# Patient Record
Sex: Female | Born: 1960
Health system: Southern US, Community
[De-identification: ages and names within clinical notes are randomized; demographics above are authoritative.]

## PROBLEM LIST (undated history)

## (undated) DIAGNOSIS — A6000 Herpesviral infection of urogenital system, unspecified: Secondary | ICD-10-CM

## (undated) DIAGNOSIS — M359 Systemic involvement of connective tissue, unspecified: Secondary | ICD-10-CM

## (undated) DIAGNOSIS — F329 Major depressive disorder, single episode, unspecified: Secondary | ICD-10-CM

## (undated) DIAGNOSIS — F419 Anxiety disorder, unspecified: Secondary | ICD-10-CM

## (undated) DIAGNOSIS — N870 Mild cervical dysplasia: Secondary | ICD-10-CM

## (undated) DIAGNOSIS — D649 Anemia, unspecified: Secondary | ICD-10-CM

## (undated) DIAGNOSIS — F32A Depression, unspecified: Secondary | ICD-10-CM

## (undated) DIAGNOSIS — Z9852 Vasectomy status: Secondary | ICD-10-CM

## (undated) DIAGNOSIS — Z1211 Encounter for screening for malignant neoplasm of colon: Secondary | ICD-10-CM

## (undated) HISTORY — DX: Depression, unspecified: F32.A

## (undated) HISTORY — DX: Encounter for screening for malignant neoplasm of colon: Z12.11

## (undated) HISTORY — DX: Vasectomy status: Z98.52

## (undated) HISTORY — DX: Major depressive disorder, single episode, unspecified: F32.9

## (undated) HISTORY — DX: Mild cervical dysplasia: N87.0

## (undated) HISTORY — PX: VASECTOMY: SHX75

## (undated) HISTORY — DX: Anemia, unspecified: D64.9

## (undated) HISTORY — DX: Herpesviral infection of urogenital system, unspecified: A60.00

## (undated) HISTORY — DX: Anxiety disorder, unspecified: F41.9

## (undated) HISTORY — PX: FOOT SURGERY: SHX648

---
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
## 1999-05-09 ENCOUNTER — Other Ambulatory Visit: Admission: RE | Admit: 1999-05-09 | Discharge: 1999-05-09 | Payer: Self-pay | Admitting: Obstetrics and Gynecology

## 2000-05-28 ENCOUNTER — Other Ambulatory Visit: Admission: RE | Admit: 2000-05-28 | Discharge: 2000-05-28 | Payer: Self-pay | Admitting: Obstetrics and Gynecology

## 2000-06-12 ENCOUNTER — Encounter: Payer: Self-pay | Admitting: Obstetrics and Gynecology

## 2000-06-12 ENCOUNTER — Ambulatory Visit (HOSPITAL_COMMUNITY): Admission: RE | Admit: 2000-06-12 | Discharge: 2000-06-12 | Payer: Self-pay | Admitting: Obstetrics and Gynecology

## 2001-06-02 ENCOUNTER — Other Ambulatory Visit: Admission: RE | Admit: 2001-06-02 | Discharge: 2001-06-02 | Payer: Self-pay | Admitting: Obstetrics and Gynecology

## 2001-06-15 ENCOUNTER — Encounter: Payer: Self-pay | Admitting: Obstetrics and Gynecology

## 2001-06-15 ENCOUNTER — Ambulatory Visit (HOSPITAL_COMMUNITY): Admission: RE | Admit: 2001-06-15 | Discharge: 2001-06-15 | Payer: Self-pay | Admitting: Obstetrics and Gynecology

## 2002-06-20 ENCOUNTER — Other Ambulatory Visit: Admission: RE | Admit: 2002-06-20 | Discharge: 2002-06-20 | Payer: Self-pay | Admitting: Obstetrics and Gynecology

## 2002-06-29 ENCOUNTER — Encounter: Admission: RE | Admit: 2002-06-29 | Discharge: 2002-06-29 | Payer: Self-pay | Admitting: Obstetrics and Gynecology

## 2002-06-29 ENCOUNTER — Encounter: Payer: Self-pay | Admitting: Obstetrics and Gynecology

## 2003-08-12 DIAGNOSIS — N870 Mild cervical dysplasia: Secondary | ICD-10-CM

## 2003-08-12 HISTORY — DX: Mild cervical dysplasia: N87.0

## 2003-08-14 ENCOUNTER — Other Ambulatory Visit: Admission: RE | Admit: 2003-08-14 | Discharge: 2003-08-14 | Payer: Self-pay | Admitting: Obstetrics and Gynecology

## 2003-09-25 ENCOUNTER — Encounter: Admission: RE | Admit: 2003-09-25 | Discharge: 2003-09-25 | Payer: Self-pay | Admitting: Obstetrics and Gynecology

## 2003-12-18 ENCOUNTER — Other Ambulatory Visit: Admission: RE | Admit: 2003-12-18 | Discharge: 2003-12-18 | Payer: Self-pay | Admitting: Obstetrics and Gynecology

## 2004-05-22 ENCOUNTER — Other Ambulatory Visit: Admission: RE | Admit: 2004-05-22 | Discharge: 2004-05-22 | Payer: Self-pay | Admitting: Obstetrics and Gynecology

## 2004-08-15 ENCOUNTER — Other Ambulatory Visit: Admission: RE | Admit: 2004-08-15 | Discharge: 2004-08-15 | Payer: Self-pay | Admitting: Obstetrics and Gynecology

## 2004-09-26 ENCOUNTER — Encounter: Admission: RE | Admit: 2004-09-26 | Discharge: 2004-09-26 | Payer: Self-pay | Admitting: Obstetrics and Gynecology

## 2004-11-07 ENCOUNTER — Ambulatory Visit: Payer: Self-pay | Admitting: Unknown Physician Specialty

## 2005-09-03 ENCOUNTER — Other Ambulatory Visit: Admission: RE | Admit: 2005-09-03 | Discharge: 2005-09-03 | Payer: Self-pay | Admitting: Obstetrics and Gynecology

## 2005-11-04 ENCOUNTER — Encounter: Admission: RE | Admit: 2005-11-04 | Discharge: 2005-11-04 | Payer: Self-pay | Admitting: Obstetrics and Gynecology

## 2006-03-16 ENCOUNTER — Ambulatory Visit: Payer: Self-pay | Admitting: Urology

## 2006-10-21 ENCOUNTER — Other Ambulatory Visit: Admission: RE | Admit: 2006-10-21 | Discharge: 2006-10-21 | Payer: Self-pay | Admitting: Obstetrics and Gynecology

## 2006-11-12 ENCOUNTER — Encounter: Admission: RE | Admit: 2006-11-12 | Discharge: 2006-11-12 | Payer: Self-pay | Admitting: Obstetrics and Gynecology

## 2007-11-11 ENCOUNTER — Other Ambulatory Visit: Admission: RE | Admit: 2007-11-11 | Discharge: 2007-11-11 | Payer: Self-pay | Admitting: Obstetrics and Gynecology

## 2007-11-29 ENCOUNTER — Encounter: Admission: RE | Admit: 2007-11-29 | Discharge: 2007-11-29 | Payer: Self-pay | Admitting: Obstetrics and Gynecology

## 2009-01-04 ENCOUNTER — Encounter: Admission: RE | Admit: 2009-01-04 | Discharge: 2009-01-04 | Payer: Self-pay | Admitting: Obstetrics and Gynecology

## 2009-02-02 ENCOUNTER — Emergency Department: Payer: Self-pay | Admitting: Emergency Medicine

## 2009-02-16 ENCOUNTER — Encounter: Payer: Self-pay | Admitting: Obstetrics and Gynecology

## 2009-02-16 ENCOUNTER — Ambulatory Visit: Payer: Self-pay | Admitting: Obstetrics and Gynecology

## 2009-02-16 ENCOUNTER — Other Ambulatory Visit: Admission: RE | Admit: 2009-02-16 | Discharge: 2009-02-16 | Payer: Self-pay | Admitting: Obstetrics and Gynecology

## 2009-08-29 ENCOUNTER — Ambulatory Visit: Payer: Self-pay | Admitting: Obstetrics and Gynecology

## 2010-02-01 ENCOUNTER — Encounter: Admission: RE | Admit: 2010-02-01 | Discharge: 2010-02-01 | Payer: Self-pay | Admitting: Obstetrics and Gynecology

## 2010-02-21 ENCOUNTER — Ambulatory Visit: Payer: Self-pay | Admitting: Obstetrics and Gynecology

## 2010-02-21 ENCOUNTER — Other Ambulatory Visit: Admission: RE | Admit: 2010-02-21 | Discharge: 2010-02-21 | Payer: Self-pay | Admitting: Obstetrics and Gynecology

## 2010-11-13 ENCOUNTER — Emergency Department: Payer: Self-pay | Admitting: Emergency Medicine

## 2011-01-10 ENCOUNTER — Other Ambulatory Visit: Payer: Self-pay | Admitting: Obstetrics and Gynecology

## 2011-01-10 DIAGNOSIS — Z1231 Encounter for screening mammogram for malignant neoplasm of breast: Secondary | ICD-10-CM

## 2011-02-05 ENCOUNTER — Ambulatory Visit
Admission: RE | Admit: 2011-02-05 | Discharge: 2011-02-05 | Disposition: A | Discharge status: Home / Self Care | Payer: 59 | Source: Ambulatory Visit | Attending: Obstetrics and Gynecology | Admitting: Obstetrics and Gynecology

## 2011-02-05 DIAGNOSIS — Z1231 Encounter for screening mammogram for malignant neoplasm of breast: Secondary | ICD-10-CM

## 2011-02-25 ENCOUNTER — Other Ambulatory Visit: Payer: Self-pay | Admitting: Obstetrics and Gynecology

## 2011-02-25 ENCOUNTER — Encounter (INDEPENDENT_AMBULATORY_CARE_PROVIDER_SITE_OTHER): Payer: BC Managed Care – PPO | Admitting: Obstetrics and Gynecology

## 2011-02-25 ENCOUNTER — Other Ambulatory Visit (HOSPITAL_COMMUNITY)
Admission: RE | Admit: 2011-02-25 | Discharge: 2011-02-25 | Disposition: A | Discharge status: Home / Self Care | Payer: BC Managed Care – PPO | Source: Ambulatory Visit | Attending: Obstetrics and Gynecology | Admitting: Obstetrics and Gynecology

## 2011-02-25 DIAGNOSIS — Z124 Encounter for screening for malignant neoplasm of cervix: Secondary | ICD-10-CM | POA: Insufficient documentation

## 2011-02-25 DIAGNOSIS — R823 Hemoglobinuria: Secondary | ICD-10-CM

## 2011-02-25 DIAGNOSIS — Z01419 Encounter for gynecological examination (general) (routine) without abnormal findings: Secondary | ICD-10-CM

## 2011-03-20 DIAGNOSIS — Z1211 Encounter for screening for malignant neoplasm of colon: Secondary | ICD-10-CM

## 2011-03-24 ENCOUNTER — Other Ambulatory Visit: Payer: Self-pay | Admitting: *Deleted

## 2011-03-24 DIAGNOSIS — Z1211 Encounter for screening for malignant neoplasm of colon: Secondary | ICD-10-CM

## 2011-03-24 LAB — POC HEMOCCULT BLD/STL (OFFICE/1-CARD/DIAGNOSTIC): Fecal Occult Blood, POC: NEGATIVE

## 2011-04-03 ENCOUNTER — Telehealth: Payer: Self-pay | Admitting: *Deleted

## 2011-04-03 DIAGNOSIS — N912 Amenorrhea, unspecified: Secondary | ICD-10-CM

## 2011-04-03 NOTE — Telephone Encounter (Signed)
I suspect she is menopausal. Have her come in for [facility]. I will order it. Ask her to call me in a week for the results and I will come to the phone.

## 2011-04-03 NOTE — Telephone Encounter (Signed)
Patient took medroxyprogesterone 10mg  for 5 days as instructed.  Last pill was taken on August 1st, and still has not gotten period.  She has been having cramping but no spotting or flow. Does she need to take another round or other plan?

## 2011-04-03 NOTE — Telephone Encounter (Signed)
Left detailed message on patients voice mail per her request.  To call back to set lab appointment and call next week for results and DG will come to the phone.

## 2011-04-15 ENCOUNTER — Ambulatory Visit: Payer: Self-pay | Admitting: Sports Medicine

## 2011-04-22 ENCOUNTER — Other Ambulatory Visit (INDEPENDENT_AMBULATORY_CARE_PROVIDER_SITE_OTHER): Payer: BC Managed Care – PPO

## 2011-04-22 DIAGNOSIS — N912 Amenorrhea, unspecified: Secondary | ICD-10-CM

## 2011-05-05 ENCOUNTER — Encounter: Payer: Self-pay | Admitting: Sports Medicine

## 2011-05-12 ENCOUNTER — Encounter: Payer: Self-pay | Admitting: Sports Medicine

## 2011-05-18 ENCOUNTER — Other Ambulatory Visit: Payer: Self-pay | Admitting: Obstetrics and Gynecology

## 2012-02-10 ENCOUNTER — Other Ambulatory Visit: Payer: Self-pay | Admitting: Obstetrics and Gynecology

## 2012-02-10 DIAGNOSIS — Z9889 Other specified postprocedural states: Secondary | ICD-10-CM

## 2012-02-10 DIAGNOSIS — Z1231 Encounter for screening mammogram for malignant neoplasm of breast: Secondary | ICD-10-CM

## 2012-02-18 ENCOUNTER — Ambulatory Visit
Admission: RE | Admit: 2012-02-18 | Discharge: 2012-02-18 | Disposition: A | Discharge status: Home / Self Care | Payer: BC Managed Care – PPO | Source: Ambulatory Visit | Attending: Obstetrics and Gynecology | Admitting: Obstetrics and Gynecology

## 2012-02-18 DIAGNOSIS — Z9889 Other specified postprocedural states: Secondary | ICD-10-CM

## 2012-02-18 DIAGNOSIS — Z1231 Encounter for screening mammogram for malignant neoplasm of breast: Secondary | ICD-10-CM

## 2012-05-05 ENCOUNTER — Ambulatory Visit: Payer: Self-pay | Admitting: Family Medicine

## 2012-06-09 ENCOUNTER — Other Ambulatory Visit: Payer: Self-pay | Admitting: Obstetrics and Gynecology

## 2012-06-14 ENCOUNTER — Other Ambulatory Visit: Payer: Self-pay | Admitting: Obstetrics and Gynecology

## 2012-07-19 ENCOUNTER — Encounter: Payer: Self-pay | Admitting: Obstetrics and Gynecology

## 2012-07-19 ENCOUNTER — Other Ambulatory Visit (HOSPITAL_COMMUNITY)
Admission: RE | Admit: 2012-07-19 | Discharge: 2012-07-19 | Disposition: A | Discharge status: Home / Self Care | Payer: BC Managed Care – PPO | Source: Ambulatory Visit | Attending: Obstetrics and Gynecology | Admitting: Obstetrics and Gynecology

## 2012-07-19 ENCOUNTER — Ambulatory Visit (INDEPENDENT_AMBULATORY_CARE_PROVIDER_SITE_OTHER): Payer: BC Managed Care – PPO | Admitting: Obstetrics and Gynecology

## 2012-07-19 VITALS — BP 124/76 | Ht 62.0 in | Wt 172.0 lb

## 2012-07-19 DIAGNOSIS — Z01419 Encounter for gynecological examination (general) (routine) without abnormal findings: Secondary | ICD-10-CM

## 2012-07-19 DIAGNOSIS — N870 Mild cervical dysplasia: Secondary | ICD-10-CM | POA: Insufficient documentation

## 2012-07-19 DIAGNOSIS — Z8619 Personal history of other infectious and parasitic diseases: Secondary | ICD-10-CM | POA: Insufficient documentation

## 2012-07-19 DIAGNOSIS — A6000 Herpesviral infection of urogenital system, unspecified: Secondary | ICD-10-CM | POA: Insufficient documentation

## 2012-07-19 MED ORDER — VALTREX 1 G PO TABS: 1000.0000 mg | ORAL_TABLET | Freq: Every day | ORAL | Status: DC

## 2012-07-19 NOTE — Progress Notes (Signed)
Patient came to see me today for her annual GYN exam. Last year she became amenorrheic. She had an elevated FSH. She does have hot flashes and sleep disturbance. She does not want to take HRT. She sees a rheumatologist in  [place] she's moved to. She is up-to-date on mammograms. She has never had a bone density. She is having no vaginal bleeding. She is having no pelvic pain. In 2005 she had CIN-1 on Pap smear. Colposcopy with biopsy confirmed it. We watched it and she is now had 9 normal Pap smears. She takes Valtrex for HSV 2 with good results. Several years ago a colonoscopy was attempted in [place]. The gastroenterologist said he could not get through her colon due to tortuosity. She does her lab elsewhere.  Physical examination:Kim Julian Reil present. HEENT within normal limits. Neck: Thyroid not large. No masses. Supraclavicular nodes: not enlarged. Breasts: Examined in both sitting and lying  position. No skin changes and no masses. Abdomen: Soft no guarding rebound or masses or hernia. Pelvic: External: Within normal limits. BUS: Within normal limits. Vaginal:within normal limits. Good estrogen effect. No evidence of cystocele rectocele or enterocele. Cervix: clean. Uterus: Normal size and shape. Adnexa: No masses. Rectovaginal exam: Confirmatory and negative. Extremities: Within normal limits.  Assessment: #1. CIN-1 #2. Hot flashes #3. HSV-2  Plan: Continue Valtrex. Continue yearly mammograms. She will find a gynecologist in [place]. She will get a bone density in [place]. She will find a gastroenterologist in [place]. We discussed virtual  colonoscopy. Pap done.The new Pap smear guidelines were discussed with the patient.

## 2012-07-19 NOTE — Addendum Note (Signed)
Addended by: Dayna Barker on: 07/19/2012 11:45 AM   Modules accepted: Orders

## 2012-07-19 NOTE — Patient Instructions (Addendum)
Continue yearly mammograms. Have your rheumatologist find you a gynecologist and a gastroenterologist. Have a colonoscopy. If that is not possible discuss  a virtual colonoscopy. Scheduled bone density.

## 2012-07-20 LAB — URINALYSIS W MICROSCOPIC + REFLEX CULTURE
Bacteria, UA: NONE SEEN
Bilirubin Urine: NEGATIVE
Ketones, ur: NEGATIVE mg/dL
Nitrite: NEGATIVE
Protein, ur: NEGATIVE mg/dL
Squamous Epithelial / LPF: NONE SEEN
Urobilinogen, UA: 0.2 mg/dL (ref 0.0–1.0)

## 2014-06-12 ENCOUNTER — Encounter: Payer: Self-pay | Admitting: Obstetrics and Gynecology

## 2015-02-26 ENCOUNTER — Encounter: Payer: Self-pay | Admitting: Family Medicine

## 2015-02-26 ENCOUNTER — Ambulatory Visit (INDEPENDENT_AMBULATORY_CARE_PROVIDER_SITE_OTHER): Payer: BLUE CROSS/BLUE SHIELD | Admitting: Family Medicine

## 2015-02-26 VITALS — BP 110/68 | HR 76 | Temp 97.9°F | Resp 16 | Ht 63.0 in | Wt 140.0 lb

## 2015-02-26 DIAGNOSIS — R350 Frequency of micturition: Secondary | ICD-10-CM

## 2015-02-26 DIAGNOSIS — R609 Edema, unspecified: Secondary | ICD-10-CM | POA: Insufficient documentation

## 2015-02-26 DIAGNOSIS — E860 Dehydration: Secondary | ICD-10-CM | POA: Diagnosis not present

## 2015-02-26 DIAGNOSIS — D649 Anemia, unspecified: Secondary | ICD-10-CM

## 2015-02-26 DIAGNOSIS — M069 Rheumatoid arthritis, unspecified: Secondary | ICD-10-CM | POA: Insufficient documentation

## 2015-02-26 DIAGNOSIS — F32A Depression, unspecified: Secondary | ICD-10-CM | POA: Insufficient documentation

## 2015-02-26 DIAGNOSIS — N3090 Cystitis, unspecified without hematuria: Secondary | ICD-10-CM | POA: Diagnosis not present

## 2015-02-26 DIAGNOSIS — K589 Irritable bowel syndrome without diarrhea: Secondary | ICD-10-CM | POA: Insufficient documentation

## 2015-02-26 DIAGNOSIS — F3281 Premenstrual dysphoric disorder: Secondary | ICD-10-CM | POA: Insufficient documentation

## 2015-02-26 DIAGNOSIS — J309 Allergic rhinitis, unspecified: Secondary | ICD-10-CM | POA: Insufficient documentation

## 2015-02-26 DIAGNOSIS — E7800 Pure hypercholesterolemia, unspecified: Secondary | ICD-10-CM | POA: Insufficient documentation

## 2015-02-26 DIAGNOSIS — F329 Major depressive disorder, single episode, unspecified: Secondary | ICD-10-CM | POA: Insufficient documentation

## 2015-02-26 DIAGNOSIS — M3500 Sicca syndrome, unspecified: Secondary | ICD-10-CM | POA: Insufficient documentation

## 2015-02-26 DIAGNOSIS — K219 Gastro-esophageal reflux disease without esophagitis: Secondary | ICD-10-CM | POA: Insufficient documentation

## 2015-02-26 HISTORY — DX: Anemia, unspecified: D64.9

## 2015-02-26 LAB — POCT URINALYSIS DIPSTICK
BILIRUBIN UA: NEGATIVE
Glucose, UA: NEGATIVE
KETONES UA: NEGATIVE
Leukocytes, UA: NEGATIVE
NITRITE UA: NEGATIVE
Protein, UA: NEGATIVE
RBC UA: NEGATIVE
Spec Grav, UA: 1.015
Urobilinogen, UA: 0.2
pH, UA: 7

## 2015-02-26 NOTE — Progress Notes (Signed)
Subjective:    Patient ID: Cheryl Cooke, female    DOB: 09-29-1960, 54 y.o.   MRN: [id number]  Urinary Frequency  This is a new problem. The current episode started in the past 7 days. The problem has been waxing and waning. The quality of the pain is described as aching. The pain is at a severity of 4/10. The pain is moderate. There has been no fever. Associated symptoms include chills, flank pain, frequency, hematuria, hesitancy, nausea and urgency. Pertinent negatives include no discharge, possible pregnancy, sweats or vomiting. She has tried increased fluids for the symptoms.   Was having nausea when she was driving.  That part is better. Also, had sharp back pain with squatting. Does have some lower abdominal tenderness.  Was probably dehydrated at the time.   Review of Systems  Constitutional: Positive for chills.  Gastrointestinal: Positive for nausea. Negative for vomiting.  Genitourinary: Positive for hesitancy, urgency, frequency, hematuria and flank pain.   BP 110/68 mmHg  Pulse 76  Temp(Src) 97.9 F (36.6 C) (Oral)  Resp 16  Ht 5\' 3"  (1.6 m)  Wt 140 lb (63.504 kg)  BMI 24.81 kg/m2   Patient Active Problem List   Diagnosis Date Noted  . Allergic rhinitis 02/26/2015  . Absolute anemia 02/26/2015  . Clinical depression 02/26/2015  . Accumulation of fluid in tissues 02/26/2015  . Acid reflux 02/26/2015  . Herpes 02/26/2015  . Hypercholesteremia 02/26/2015  . Adaptive colitis 02/26/2015  . Late luteal phase dysphoric disorder (LLPDD) 02/26/2015  . Arthritis or polyarthritis, rheumatoid 02/26/2015  . Gougerout-Sjoegren syndrome 02/26/2015  . Elevated cholesterol   . Dysplasia of cervix, low grade (CIN 1)   . Rheumatoid arthritis(714.0)   . Herpes progenitalis    Past Medical History  Diagnosis Date  . H/O: vasectomy     HUSBAND HAS HAD VASECTOMY  . Dysplasia of cervix, low grade (CIN 1)     OBSERVATION  . Rheumatoid arthritis(714.0)   . Elevated  cholesterol   . Herpes progenitalis    No current outpatient prescriptions on file prior to visit.   No current facility-administered medications on file prior to visit.   Allergies  Allergen Reactions  . Biaxin [Clarithromycin]   . Clarithromycin Nausea Only  . Levofloxacin     Other reaction(s): Nausea  . Shrimp [Shellfish Allergy] Swelling  . Sulfa Antibiotics     ? REACTION   Past Surgical History  Procedure Laterality Date  . Foot surgery  1979 AND 2002    RIGHT 1979, LEFT 2002; Bunion and hammertoes  . Breast surgery Bilateral 1979    REDUCTION   History   Social History  . Marital Status: Married    Spouse Name: N/A  . Number of Children: 1  . Years of Education: H/S   Occupational History  . Massage Therapist    Social History Main Topics  . Smoking status: Former Smoker -- 3 years    Quit date: 08/11/1983  . Smokeless tobacco: Never Used     Comment: was a social smoker  . Alcohol Use: Yes     Comment: Rare  . Drug Use: No  . Sexual Activity: Yes    Birth Control/ Protection: Post-menopausal   Other Topics Concern  . Not on file   Social History Narrative   Family History  Problem Relation Age of Onset  . Diabetes Mother   . Hypertension Mother   . Alzheimer's disease Mother   . Allergies Mother   .  Stroke Mother   . Heart attack Mother   . Heart disease Father   . Gout Father   . Heart attack Father   . Cancer Maternal Grandmother     UTERINE CANCER  . Stroke Maternal Grandfather       Objective:   Physical Exam  Constitutional: She is oriented to person, place, and time. She appears well-developed and well-nourished.  Abdominal: Soft. Bowel sounds are normal.  Neurological: She is alert and oriented to person, place, and time.  Psychiatric: She has a normal mood and affect. Her behavior is normal.   BP 110/68 mmHg  Pulse 76  Temp(Src) 97.9 F (36.6 C) (Oral)  Resp 16  Ht 5\' 3"  (1.6 m)  Wt 140 lb (63.504 kg)  BMI 24.81 kg/m2       Assessment & Plan:   1. Urinary frequency Normal urine today. Patient is feeling better. Last few "infections" have had negative cultures.   - POCT urinalysis dipstick  2. Cystitis Improved today.   3. Dehydration Suspect cause of initial symptoms when driving. Specific gravity is 1015, still room for improvement. Increase fluid intake. Please call back if condition worsens or does not continue to improve.     Zanyia Rana, MD

## 2015-05-09 ENCOUNTER — Encounter: Payer: Self-pay | Admitting: Family Medicine

## 2015-05-09 ENCOUNTER — Ambulatory Visit (INDEPENDENT_AMBULATORY_CARE_PROVIDER_SITE_OTHER): Payer: BLUE CROSS/BLUE SHIELD | Admitting: Family Medicine

## 2015-05-09 VITALS — BP 100/62 | HR 74 | Temp 97.8°F | Resp 16 | Ht 63.0 in | Wt 142.0 lb

## 2015-05-09 DIAGNOSIS — E7800 Pure hypercholesterolemia, unspecified: Secondary | ICD-10-CM

## 2015-05-09 DIAGNOSIS — M069 Rheumatoid arthritis, unspecified: Secondary | ICD-10-CM

## 2015-05-09 DIAGNOSIS — Z23 Encounter for immunization: Secondary | ICD-10-CM

## 2015-05-09 DIAGNOSIS — Z1239 Encounter for other screening for malignant neoplasm of breast: Secondary | ICD-10-CM | POA: Diagnosis not present

## 2015-05-09 DIAGNOSIS — Z1211 Encounter for screening for malignant neoplasm of colon: Secondary | ICD-10-CM | POA: Diagnosis not present

## 2015-05-09 DIAGNOSIS — Z0000 Encounter for general adult medical examination without abnormal findings: Secondary | ICD-10-CM | POA: Diagnosis not present

## 2015-05-09 LAB — POCT URINALYSIS DIPSTICK
Bilirubin, UA: NEGATIVE
Blood, UA: NEGATIVE
Glucose, UA: NEGATIVE
KETONES UA: NEGATIVE
Leukocytes, UA: NEGATIVE
Nitrite, UA: NEGATIVE
Protein, UA: NEGATIVE
Spec Grav, UA: 1.01
UROBILINOGEN UA: 0.2
pH, UA: 6.5

## 2015-05-09 LAB — IFOBT (OCCULT BLOOD): IFOBT: NEGATIVE

## 2015-05-09 NOTE — Progress Notes (Signed)
Patient ID: ORINE GOGA, female   DOB: 1960/12/29, 54 y.o.   MRN: [id number]        [name], Female    DOB: 1960/11/29, 54 y.o.   MRN: [id number] Visit Date: 05/09/2015  Today's Provider: Livia Rana, MD   Chief Complaint  Patient presents with  . Annual Exam   Subjective:    Annual physical exam SAMHITA KRETSCH is a 54 y.o. female who presents today for health maintenance and complete physical. She feels well. She reports exercising daily. She reports she is sleeping well.  04/18/14 CPE 04/18/14 Pap-neg; HPV-neg 04/18/14 EKG 02/18/12 Mammogram  Results for orders placed or performed in visit on 05/09/15  POCT urinalysis dipstick  Result Value Ref Range   Color, UA straw    Clarity, UA clear    Glucose, UA neg    Bilirubin, UA neg    Ketones, UA neg    Spec Grav, UA 1.010    Blood, UA neg    pH, UA 6.5    Protein, UA neg    Urobilinogen, UA 0.2    Nitrite, UA neg    Leukocytes, UA Negative Negative    -----------------------------------------------------------------   Review of Systems  Constitutional: Negative.   HENT: Positive for congestion and sinus pressure.   Eyes: Negative.   Respiratory: Negative.   Cardiovascular: Negative.   Gastrointestinal: Negative.   Endocrine: Negative.   Genitourinary: Negative.   Musculoskeletal: Positive for arthralgias.  Skin: Negative.   Allergic/Immunologic: Positive for environmental allergies.  Neurological: Negative.   Hematological: Negative.   Psychiatric/Behavioral: Negative.     Social History She  reports that she quit smoking about 31 years ago. She has never used smokeless tobacco. She reports that she drinks alcohol. She reports that she does not use illicit drugs. Social History   Social History  . Marital Status: Married    Spouse Name: N/A  . Number of Children: 1  . Years of Education: H/S   Occupational History  . Massage Therapist    Social History Main  Topics  . Smoking status: Former Smoker -- 3 years    Quit date: 08/11/1983  . Smokeless tobacco: Never Used     Comment: was a social smoker  . Alcohol Use: Yes     Comment: Rare  . Drug Use: No  . Sexual Activity: Yes    Birth Control/ Protection: Post-menopausal   Other Topics Concern  . None   Social History Narrative    Patient Active Problem List   Diagnosis Date Noted  . Allergic rhinitis 02/26/2015  . Absolute anemia 02/26/2015  . Clinical depression 02/26/2015  . Accumulation of fluid in tissues 02/26/2015  . Acid reflux 02/26/2015  . Herpes 02/26/2015  . Hypercholesteremia 02/26/2015  . Adaptive colitis 02/26/2015  . Late luteal phase dysphoric disorder (LLPDD) 02/26/2015  . Arthritis or polyarthritis, rheumatoid 02/26/2015  . Gougerout-Sjoegren syndrome 02/26/2015  . Cystitis 02/26/2015  . Dehydration 02/26/2015  . Elevated cholesterol   . Dysplasia of cervix, low grade (CIN 1)   . Rheumatoid arthritis(714.0)   . Herpes progenitalis     Past Surgical History  Procedure Laterality Date  . Foot surgery  1979 AND 2002    RIGHT 1979, LEFT 2002; Bunion and hammertoes  . Breast surgery Bilateral 1979    REDUCTION    Family History  Family Status  Relation Status Death Age  . Mother Deceased    Her family history includes Allergies in her  mother; Alzheimer's disease in her mother; Cancer in her maternal grandmother; Diabetes in her mother; Gout in her father; Heart attack in her father and mother; Heart disease in her father; Hypertension in her mother; Stroke in her maternal grandfather and mother.    Allergies  Allergen Reactions  . Biaxin [Clarithromycin]   . Clarithromycin Nausea Only  . Levofloxacin     Other reaction(s): Nausea  . Shrimp [Shellfish Allergy] Swelling  . Sulfa Antibiotics Swelling    Previous Medications   CALCIUM-VITAMIN D (OSCAL WITH D) 250-125 MG-UNIT PER TABLET    Take 1 tablet by mouth daily.    ETANERCEPT (ENBREL) 50  MG/ML INJECTION    Inject 50 mg into the skin.    FOLIC ACID (FOLVITE) 1 MG TABLET    Take 1 mg by mouth daily.    LEFLUNOMIDE (ARAVA) 20 MG TABLET    Take 20 mg by mouth daily.    MOMETASONE (ELOCON) 0.1 % CREAM        Patient Care Team: Shaquna Rana, MD as PCP - General (Family Medicine)     Objective:   Vitals: BP 100/62 mmHg  Pulse 74  Temp(Src) 97.8 F (36.6 C) (Oral)  Resp 16  Ht 5\' 3"  (1.6 m)  Wt 142 lb (64.411 kg)  BMI 25.16 kg/m2   Physical Exam  Constitutional: She is oriented to person, place, and time. She appears well-developed and well-nourished.  HENT:  Head: Normocephalic and atraumatic.  Right Ear: Tympanic membrane, external ear and ear canal normal.  Left Ear: Tympanic membrane, external ear and ear canal normal.  Nose: Nose normal.  Mouth/Throat: Uvula is midline, oropharynx is clear and moist and mucous membranes are normal.  Eyes: Conjunctivae, EOM and lids are normal. Pupils are equal, round, and reactive to light.  Neck: Trachea normal and normal range of motion. Neck supple. Carotid bruit is not present. No thyroid mass and no thyromegaly present.  Cardiovascular: Normal rate, regular rhythm and normal heart sounds.   Pulmonary/Chest: Effort normal and breath sounds normal.  Filling defect at 6 o'clock on right breast. Well healed surgical scar.  Abdominal: Soft. Normal appearance and bowel sounds are normal. There is no hepatosplenomegaly. There is no tenderness.  Genitourinary: No breast swelling, tenderness or discharge.  Musculoskeletal: Normal range of motion.  Lymphadenopathy:    She has no cervical adenopathy.    She has no axillary adenopathy.  Neurological: She is alert and oriented to person, place, and time. She has normal strength. No cranial nerve deficit.  Skin: Skin is warm, dry and intact.  Psychiatric: She has a normal mood and affect. Her speech is normal and behavior is normal. Judgment and thought content normal. Cognition and  memory are normal.     Depression Screen PHQ 2/9 Scores 05/09/2015  PHQ - 2 Score 0      Assessment & Plan:     Routine Health Maintenance and Physical Exam  Exercise Activities and Dietary recommendations Goals    . Exercise 150 minutes per week (moderate activity)       Immunization History  Administered Date(s) Administered  . Influenza,inj,Quad PF,36+ Mos 05/09/2015  . Tdap 04/18/2014    Health Maintenance  Topic Date Due  . HIV Screening  04/01/1975  . COLONOSCOPY  03/31/2010  . MAMMOGRAM  02/17/2014  . PAP SMEAR  07/20/2015  . INFLUENZA VACCINE  03/11/2016  . TETANUS/TDAP  04/18/2024  . Hepatitis C Screening  Completed       1. Annual  physical exam Stable. Patient advised to continue eating healthy and exercise daily. - POCT urinalysis dipstick  2. Need for influenza vaccination - Flu Vaccine QUAD 36+ mos IM  3. Breast cancer screening - MM DIGITAL SCREENING BILATERAL; Future  4. Colon cancer screening - IFOBT POC (occult bld, rslt in office)  5. Hypercholesteremia - CBC with Differential/Platelet - Comprehensive metabolic panel - Lipid Panel With LDL/HDL Ratio - TSH    6. Rheumatoid arthritis  Patient advised to continue regular F/U with her rheumatologist.       Patient seen and examined by Dr. Jerrell Belfast, and note scribed by Philbert Riser. Dimas, CMA.  I have reviewed the document for accuracy and completeness and I agree with above. Jerrell Belfast, MD   Antonisha Rana, MD    --------------------------------------------------------------------

## 2015-05-09 NOTE — Patient Instructions (Signed)
Please call the [place] at [facility] to schedule this at [phone]   Please call us back with information on where and when your colonoscopy was done.

## 2015-05-10 ENCOUNTER — Ambulatory Visit
Admission: RE | Admit: 2015-05-10 | Discharge: 2015-05-10 | Disposition: A | Discharge status: Home / Self Care | Payer: BLUE CROSS/BLUE SHIELD | Source: Ambulatory Visit | Attending: Family Medicine | Admitting: Family Medicine

## 2015-05-10 DIAGNOSIS — Z1231 Encounter for screening mammogram for malignant neoplasm of breast: Secondary | ICD-10-CM | POA: Insufficient documentation

## 2015-05-10 DIAGNOSIS — Z1239 Encounter for other screening for malignant neoplasm of breast: Secondary | ICD-10-CM

## 2015-05-10 LAB — CBC WITH DIFFERENTIAL/PLATELET
Basophils Absolute: 0 10*3/uL (ref 0.0–0.2)
Basos: 1 %
EOS (ABSOLUTE): 0.7 10*3/uL — ABNORMAL HIGH (ref 0.0–0.4)
Eos: 10 %
Hematocrit: 36.9 % (ref 34.0–46.6)
Hemoglobin: 12.4 g/dL (ref 11.1–15.9)
Immature Grans (Abs): 0 10*3/uL (ref 0.0–0.1)
Immature Granulocytes: 0 %
Lymphocytes Absolute: 2.3 10*3/uL (ref 0.7–3.1)
Lymphs: 36 %
MCH: 30.4 pg (ref 26.6–33.0)
MCHC: 33.6 g/dL (ref 31.5–35.7)
MCV: 90 fL (ref 79–97)
Monocytes Absolute: 0.7 10*3/uL (ref 0.1–0.9)
Monocytes: 11 %
Neutrophils Absolute: 2.7 10*3/uL (ref 1.4–7.0)
Neutrophils: 42 %
Platelets: 255 10*3/uL (ref 150–379)
RBC: 4.08 x10E6/uL (ref 3.77–5.28)
RDW: 13.9 % (ref 12.3–15.4)
WBC: 6.5 10*3/uL (ref 3.4–10.8)

## 2015-05-10 LAB — COMPREHENSIVE METABOLIC PANEL
ALT: 26 IU/L (ref 0–32)
AST: 39 IU/L (ref 0–40)
Albumin/Globulin Ratio: 1.6 (ref 1.1–2.5)
Albumin: 4.1 g/dL (ref 3.5–5.5)
Alkaline Phosphatase: 80 IU/L (ref 39–117)
BUN/Creatinine Ratio: 21 (ref 9–23)
BUN: 15 mg/dL (ref 6–24)
Bilirubin Total: 0.4 mg/dL (ref 0.0–1.2)
CO2: 24 mmol/L (ref 18–29)
Calcium: 10 mg/dL (ref 8.7–10.2)
Chloride: 100 mmol/L (ref 97–108)
Creatinine, Ser: 0.73 mg/dL (ref 0.57–1.00)
GFR calc Af Amer: 107 mL/min/{1.73_m2} (ref >59)
GFR calc non Af Amer: 93 mL/min/{1.73_m2} (ref >59)
Globulin, Total: 2.6 g/dL (ref 1.5–4.5)
Glucose: 91 mg/dL (ref 65–99)
Potassium: 4.9 mmol/L (ref 3.5–5.2)
Sodium: 138 mmol/L (ref 134–144)
Total Protein: 6.7 g/dL (ref 6.0–8.5)

## 2015-05-10 LAB — LIPID PANEL WITH LDL/HDL RATIO
CHOLESTEROL TOTAL: 261 mg/dL — AB (ref 100–199)
HDL: 73 mg/dL (ref >39)
LDL CALC: 155 mg/dL — AB (ref 0–99)
LDL/HDL RATIO: 2.1 ratio (ref 0.0–3.2)
Triglycerides: 166 mg/dL — ABNORMAL HIGH (ref 0–149)
VLDL CHOLESTEROL CAL: 33 mg/dL (ref 5–40)

## 2015-05-10 LAB — TSH: TSH: 1.6 u[IU]/mL (ref 0.450–4.500)

## 2015-05-11 ENCOUNTER — Telehealth: Payer: Self-pay

## 2015-05-11 NOTE — Telephone Encounter (Signed)
Patient was currently driving, asked to be reach after 4:30.  Thanks,  -Joseline

## 2015-05-11 NOTE — Telephone Encounter (Signed)
-----   Message from Vesna Rana, MD sent at 05/10/2015 10:25 AM EDT ----- Labs stable. Cholesterol is elevated but does have high good cholesterol.  Is complicated by RA, does put her at slightly increased risk of heart disease.   Can consider medication if patient would like or continue to eat healthy, exercise and recheck annually.   Thanks.

## 2015-05-14 NOTE — Telephone Encounter (Signed)
Patient advised as directed below. Per patient will call back to let Dr. Venia Minks know if decides to consider medication or continue to eat healthy and exercise.   Thanks,  -Jacion Dismore

## 2015-09-27 ENCOUNTER — Encounter: Payer: Self-pay | Admitting: Family Medicine

## 2015-09-27 ENCOUNTER — Ambulatory Visit (INDEPENDENT_AMBULATORY_CARE_PROVIDER_SITE_OTHER): Payer: BLUE CROSS/BLUE SHIELD | Admitting: Family Medicine

## 2015-09-27 VITALS — BP 122/72 | HR 88 | Temp 98.5°F | Resp 14 | Wt 134.2 lb

## 2015-09-27 DIAGNOSIS — M059 Rheumatoid arthritis with rheumatoid factor, unspecified: Secondary | ICD-10-CM

## 2015-09-27 DIAGNOSIS — K130 Diseases of lips: Secondary | ICD-10-CM

## 2015-09-27 DIAGNOSIS — J020 Streptococcal pharyngitis: Secondary | ICD-10-CM

## 2015-09-27 DIAGNOSIS — M3500 Sicca syndrome, unspecified: Secondary | ICD-10-CM | POA: Diagnosis not present

## 2015-09-27 LAB — POCT RAPID STREP A (OFFICE): Rapid Strep A Screen: POSITIVE — AB

## 2015-09-27 MED ORDER — AMOXICILLIN 875 MG PO TABS: 875.0000 mg | ORAL_TABLET | Freq: Two times a day (BID) | ORAL | Status: DC

## 2015-09-27 NOTE — Progress Notes (Signed)
Patient ID: Cheryl Cooke, female   DOB: 1961/01/06, 55 y.o.   MRN: [id number]   [name] Female    DOB: 1961/01/16   55 y.o.   MRN: [id number] Visit Date: 09/27/2015  Today's Provider: Vernie Murders, PA   Chief Complaint  Patient presents with  . Oral Swelling    lip   Subjective:    HPI Patient presents with lip swelling and sore areas on each corner of bottom lip X 3 - 4 days. Patient reports redness, swelling, and mild discomfort. Has a history of allergies and recurrent sinus infections. Having more congestion and rhinorrhea the past month. Using Netti-Pot, Benadryl and Fluticasone for allergies with improvement. History of Sjogren's syndrome with rheumatoid arthritis treated with Enbrel and Arava.    Patient Active Problem List   Diagnosis Date Noted  . Allergic rhinitis 02/26/2015  . Absolute anemia 02/26/2015  . Clinical depression 02/26/2015  . Accumulation of fluid in tissues 02/26/2015  . Acid reflux 02/26/2015  . Herpes 02/26/2015  . Hypercholesteremia 02/26/2015  . Adaptive colitis 02/26/2015  . Late luteal phase dysphoric disorder (LLPDD) 02/26/2015  . Arthritis or polyarthritis, rheumatoid ([place]) 02/26/2015  . Gougerout-Sjoegren syndrome ([place]) 02/26/2015  . Cystitis 02/26/2015  . Dehydration 02/26/2015  . Elevated cholesterol   . Dysplasia of cervix, low grade (CIN 1)   . Rheumatoid arthritis ([place])   . Herpes progenitalis    Past Surgical History  Procedure Laterality Date  . Foot surgery  1979 AND 2002    RIGHT 1979, LEFT 2002; Bunion and hammertoes  . Breast surgery Bilateral 1979    REDUCTION  . Reduction mammaplasty Bilateral 1978   Family History  Problem Relation Age of Onset  . Diabetes Mother   . Hypertension Mother   . Alzheimer's disease Mother   . Allergies Mother   . Stroke Mother   . Heart attack Mother   . Heart disease Father   . Gout Father   . Heart attack Father   . Cancer Maternal Grandmother     UTERINE  CANCER  . Stroke Maternal Grandfather     Previous Medications   CALCIUM-VITAMIN D (OSCAL WITH D) 250-125 MG-UNIT PER TABLET    Take 1 tablet by mouth daily.    ETANERCEPT (ENBREL) 50 MG/ML INJECTION    Inject 50 mg into the skin.    LEFLUNOMIDE (ARAVA) 20 MG TABLET    Take 20 mg by mouth daily.    MOMETASONE (ELOCON) 0.1 % CREAM       Allergies  Allergen Reactions  . Biaxin [Clarithromycin]   . Clarithromycin Nausea Only  . Levofloxacin     Other reaction(s): Nausea  . Shrimp [Shellfish Allergy] Swelling  . Sulfa Antibiotics Swelling    Review of Systems  Constitutional: Negative.   HENT: Positive for congestion and rhinorrhea.        Lip swelling, lip sores  Eyes: Negative.   Respiratory: Negative.   Cardiovascular: Negative.   Gastrointestinal: Negative.   Endocrine: Negative.   Genitourinary: Negative.   Musculoskeletal: Negative.   Skin: Negative.   Allergic/Immunologic: Negative.   Neurological: Negative.   Hematological: Negative.   Psychiatric/Behavioral: Negative.     Social History  Substance Use Topics  . Smoking status: Former Smoker -- 3 years    Quit date: 08/11/1983  . Smokeless tobacco: Never Used     Comment: was a social smoker  . Alcohol Use: Yes     Comment: Rare   Objective:  BP 122/72 mmHg  Pulse 88  Temp(Src) 98.5 F (36.9 C) (Oral)  Resp 14  Wt 134 lb 3.2 oz (60.873 kg)  Physical Exam  Constitutional: She is oriented to person, place, and time. She appears well-developed and well-nourished.  HENT:  Head: Normocephalic.  Right Ear: External ear normal.  Left Ear: External ear normal.  Crusting fissure of right nostril. Red posterior pharynx without exudates. Fissuring at corners of mouth with erythema. Some tenderness of left maxillary sinus.  Eyes: Conjunctivae and EOM are normal.  Neck: Normal range of motion. Neck supple.  Cardiovascular: Normal rate and regular rhythm.   Pulmonary/Chest: Effort normal and breath sounds  normal.  Abdominal: Soft. Bowel sounds are normal.  Lymphadenopathy:    She has no cervical adenopathy.  Neurological: She is alert and oriented to person, place, and time.  Skin: There is erythema.  Corners of mouth with fissure in right nostril.      Assessment & Plan:     1. Strep pharyngitis Onset with rhinorrhea and itching of lips with swelling. Less swelling than at onset 3-4 days ago. Posterior pharynx reddened and strep test positive. Will treat with antibiotic. Recheck prn. - POCT rapid strep A - amoxicillin (AMOXIL) 875 MG tablet; Take 1 tablet (875 mg total) by mouth 2 (two) times daily.  Dispense: 20 tablet; Refill: 0  2. Cheilitis Onset over the past 3-4 days with sore throat and nasal congestion. May use Vaseline ointment on lips and treatment of strep infection should help. Crusting in right nostril honey colored as with impetigo.  3. Gougerout-Sjoegren syndrome ([place]) Diagnosed and followed by Dr. Dyann Ruddle (rheumatologist)  4. Rheumatoid arthritis with positive rheumatoid factor, involving unspecified site ([place]) Symptoms fairly controled with Arava and Enbrel. Continue follow up with Dr. Dyann Ruddle.

## 2015-10-30 ENCOUNTER — Ambulatory Visit (INDEPENDENT_AMBULATORY_CARE_PROVIDER_SITE_OTHER): Payer: BLUE CROSS/BLUE SHIELD | Admitting: Family Medicine

## 2015-10-30 ENCOUNTER — Encounter: Payer: Self-pay | Admitting: Family Medicine

## 2015-10-30 VITALS — BP 112/66 | HR 88 | Temp 97.8°F | Resp 16 | Wt 133.0 lb

## 2015-10-30 DIAGNOSIS — J0110 Acute frontal sinusitis, unspecified: Secondary | ICD-10-CM | POA: Diagnosis not present

## 2015-10-30 DIAGNOSIS — M059 Rheumatoid arthritis with rheumatoid factor, unspecified: Secondary | ICD-10-CM

## 2015-10-30 MED ORDER — AMOXICILLIN-POT CLAVULANATE 875-125 MG PO TABS: 1.0000 | ORAL_TABLET | Freq: Two times a day (BID) | ORAL | Status: DC

## 2015-10-30 NOTE — Progress Notes (Signed)
Patient ID: Cheryl Cooke, female   DOB: [birth year], 55 y.o.   MRN: [id number]        [name] Female    DOB: 07/13/1961   55 y.o.   MRN: [id number] Visit Date: 10/30/2015  Today's Provider: Yailen Rana, MD   Chief Complaint  Patient presents with  . Sinusitis   Subjective:    Sinusitis This is a new problem. The current episode started in the past 7 days. The problem has been gradually worsening since onset. Associated symptoms include chills, congestion, coughing, headaches, sinus pressure and a sore throat. Pertinent negatives include no diaphoresis, ear pain, neck pain, shortness of breath, sneezing or swollen glands. Past treatments include nothing. The treatment provided no relief.       Allergies  Allergen Reactions  . Biaxin [Clarithromycin]   . Clarithromycin Nausea Only  . Levofloxacin     Other reaction(s): Nausea  . Shrimp [Shellfish Allergy] Swelling  . Sulfa Antibiotics Swelling   Previous Medications   CALCIUM-VITAMIN D (OSCAL WITH D) 250-125 MG-UNIT PER TABLET    Take 1 tablet by mouth daily.    ETANERCEPT (ENBREL) 50 MG/ML INJECTION    Inject 50 mg into the skin.    LEFLUNOMIDE (ARAVA) 20 MG TABLET    Take 20 mg by mouth daily.    MOMETASONE (ELOCON) 0.1 % CREAM        Review of Systems  Constitutional: Positive for chills and fatigue. Negative for fever, diaphoresis, activity change, appetite change and unexpected weight change.  HENT: Positive for congestion, postnasal drip, rhinorrhea, sinus pressure and sore throat. Negative for dental problem, drooling, ear discharge, ear pain, facial swelling, hearing loss, mouth sores, nosebleeds, sneezing, tinnitus, trouble swallowing and voice change.   Eyes: Positive for pain. Negative for photophobia, discharge, redness, itching and visual disturbance.  Respiratory: Positive for cough. Negative for apnea, choking, chest tightness, shortness of breath, wheezing and stridor.     Cardiovascular: Negative for chest pain, palpitations and leg swelling.  Gastrointestinal: Positive for abdominal pain. Negative for nausea, vomiting, diarrhea, constipation, blood in stool, abdominal distention, anal bleeding and rectal pain.  Musculoskeletal: Positive for neck stiffness. Negative for neck pain.  Neurological: Positive for light-headedness and headaches. Negative for dizziness.    Social History  Substance Use Topics  . Smoking status: Former Smoker -- 3 years    Quit date: 08/11/1983  . Smokeless tobacco: Never Used     Comment: was a social smoker  . Alcohol Use: Yes     Comment: Rare   Objective:   BP 112/66 mmHg  Pulse 88  Temp(Src) 97.8 F (36.6 C) (Oral)  Resp 16  Wt 133 lb (60.328 kg)  Physical Exam  Constitutional: She is oriented to person, place, and time. She appears well-developed and well-nourished.  HENT:  Head: Normocephalic and atraumatic.  Right Ear: Tympanic membrane, external ear and ear canal normal.  Left Ear: Tympanic membrane, external ear and ear canal normal.  Nose: Mucosal edema and rhinorrhea present.  Cardiovascular: Normal rate, regular rhythm and normal heart sounds.   Pulmonary/Chest: Effort normal and breath sounds normal.  Neurological: She is alert and oriented to person, place, and time.  Psychiatric: She has a normal mood and affect. Her behavior is normal. Judgment and thought content normal.       Assessment & Plan:     1. Acute frontal sinusitis, recurrence not specified Worsening, will start antibiotic as below.  Advised pt to call if worsening  or not improved.   - amoxicillin-clavulanate (AUGMENTIN) 875-125 MG tablet; Take 1 tablet by mouth 2 (two) times daily.  Dispense: 20 tablet; Refill: 0  2. Rheumatoid arthritis with positive rheumatoid factor, involving unspecified site ([place]) Stable. Currently on Enbrel.      Patient was seen and examined by Jerrell Belfast, MD, and note scribed by Ashley Royalty, CMA.  I  have reviewed the document for accuracy and completeness and I agree with above. - Jerrell Belfast, MD   Evon Rana, MD  [facility]

## 2015-11-15 ENCOUNTER — Emergency Department: Payer: BLUE CROSS/BLUE SHIELD

## 2015-11-15 ENCOUNTER — Emergency Department
Admission: EM | Admit: 2015-11-15 | Discharge: 2015-11-15 | Disposition: A | Discharge status: Home / Self Care | Payer: BLUE CROSS/BLUE SHIELD | Attending: Emergency Medicine | Admitting: Emergency Medicine

## 2015-11-15 ENCOUNTER — Encounter: Payer: Self-pay | Admitting: Emergency Medicine

## 2015-11-15 DIAGNOSIS — E7800 Pure hypercholesterolemia, unspecified: Secondary | ICD-10-CM | POA: Diagnosis not present

## 2015-11-15 DIAGNOSIS — Y9389 Activity, other specified: Secondary | ICD-10-CM | POA: Diagnosis not present

## 2015-11-15 DIAGNOSIS — S20212A Contusion of left front wall of thorax, initial encounter: Secondary | ICD-10-CM

## 2015-11-15 DIAGNOSIS — Y92410 Unspecified street and highway as the place of occurrence of the external cause: Secondary | ICD-10-CM | POA: Diagnosis not present

## 2015-11-15 DIAGNOSIS — Z79899 Other long term (current) drug therapy: Secondary | ICD-10-CM | POA: Insufficient documentation

## 2015-11-15 DIAGNOSIS — S60940A Unspecified superficial injury of right index finger, initial encounter: Secondary | ICD-10-CM | POA: Diagnosis present

## 2015-11-15 DIAGNOSIS — Z87891 Personal history of nicotine dependence: Secondary | ICD-10-CM | POA: Diagnosis not present

## 2015-11-15 DIAGNOSIS — S62610A Displaced fracture of proximal phalanx of right index finger, initial encounter for closed fracture: Secondary | ICD-10-CM | POA: Diagnosis not present

## 2015-11-15 DIAGNOSIS — S62600A Fracture of unspecified phalanx of right index finger, initial encounter for closed fracture: Secondary | ICD-10-CM

## 2015-11-15 DIAGNOSIS — Y999 Unspecified external cause status: Secondary | ICD-10-CM | POA: Insufficient documentation

## 2015-11-15 DIAGNOSIS — G8911 Acute pain due to trauma: Secondary | ICD-10-CM

## 2015-11-15 MED ORDER — HYDROXYZINE HCL 25 MG PO TABS
ORAL_TABLET | ORAL | Status: AC
  Filled 2015-11-15: qty 1

## 2015-11-15 MED ORDER — NAPROXEN 500 MG PO TABS: 500.0000 mg | ORAL_TABLET | Freq: Two times a day (BID) | ORAL | Status: DC

## 2015-11-15 MED ORDER — CYCLOBENZAPRINE HCL 5 MG PO TABS: 5.0000 mg | ORAL_TABLET | Freq: Three times a day (TID) | ORAL | Status: DC | PRN
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
## 2015-11-15 NOTE — Discharge Instructions (Signed)
Chest Contusion A chest contusion is a deep bruise on your chest area. Contusions are the result of an injury that caused bleeding under the skin. A chest contusion may involve bruising of the skin, muscles, or ribs. The contusion may turn blue, purple, or yellow. Minor injuries will give you a painless contusion, but more severe contusions may stay painful and swollen for a few weeks. CAUSES  A contusion is usually caused by a blow, trauma, or direct force to an area of the body. SYMPTOMS   Swelling and redness of the injured area.  Discoloration of the injured area.  Tenderness and soreness of the injured area.  Pain. DIAGNOSIS  The diagnosis can be made by taking a history and performing a physical exam. An X-ray, CT scan, or MRI may be needed to determine if there were any associated injuries, such as broken bones (fractures) or internal injuries. TREATMENT  Often, the best treatment for a chest contusion is resting, icing, and applying cold compresses to the injured area. Deep breathing exercises may be recommended to reduce the risk of pneumonia. Over-the-counter medicines may also be recommended for pain control. HOME CARE INSTRUCTIONS   Put ice on the injured area.  Put ice in a plastic bag.  Place a towel between your skin and the bag.  Leave the ice on for 15-20 minutes, 03-04 times a day.  Only take over-the-counter or prescription medicines as directed by your caregiver. Your caregiver may recommend avoiding anti-inflammatory medicines (aspirin, ibuprofen, and naproxen) for 48 hours because these medicines may increase bruising.  Rest the injured area.  Perform deep-breathing exercises as directed by your caregiver.  Stop smoking if you smoke.  Do not lift objects over 5 pounds (2.3 kg) for 3 days or longer if recommended by your caregiver. SEEK IMMEDIATE MEDICAL CARE IF:   You have increased bruising or swelling.  You have pain that is getting worse.  You have  difficulty breathing.  You have dizziness, weakness, or fainting.  You have blood in your urine or stool.  You cough up or vomit blood.  Your swelling or pain is not relieved with medicines. MAKE SURE YOU:   Understand these instructions.  Will watch your condition.  Will get help right away if you are not doing well or get worse.   This information is not intended to replace advice given to you by your health care provider. Make sure you discuss any questions you have with your health care provider.   Document Released: 04/22/2001 Document Revised: 04/21/2012 Document Reviewed: 01/19/2012 Elsevier Interactive Patient Education 2016 [employer].  Cryotherapy Cryotherapy is when you put ice on your injury. Ice helps lessen pain and puffiness (swelling) after an injury. Ice works the best when you start using it in the first 24 to 48 hours after an injury. HOME CARE  Put a dry or damp towel between the ice pack and your skin.  You may press gently on the ice pack.  Leave the ice on for no more than 10 to 20 minutes at a time.  Check your skin after 5 minutes to make sure your skin is okay.  Rest at least 20 minutes between ice pack uses.  Stop using ice when your skin loses feeling (numbness).  Do not use ice on someone who cannot tell you when it hurts. This includes small children and people with memory problems (dementia). GET HELP RIGHT AWAY IF:  You have white spots on your skin.  Your skin  turns blue or pale.  Your skin feels waxy or hard.  Your puffiness gets worse. MAKE SURE YOU:   Understand these instructions.  Will watch your condition.  Will get help right away if you are not doing well or get worse.   This information is not intended to replace advice given to you by your health care provider. Make sure you discuss any questions you have with your health care provider.   Document Released: 01/14/2008 Document Revised: 10/20/2011 Document  Reviewed: 03/20/2011 Elsevier Interactive Patient Education 2016 [employer] Inc[employer]  Finger Fracture Fractures of fingers are breaks in the bones of the fingers. There are many types of fractures. There are different ways of treating these fractures. Your health care provider will discuss the best way to treat your fracture. CAUSES Traumatic injury is the main cause of broken fingers. These include:  Injuries while playing sports.  Workplace injuries.  Falls. RISK FACTORS Activities that can increase your risk of finger fractures include:  Sports.  Workplace activities that involve machinery.  A condition called osteoporosis, which can make your bones less dense and cause them to fracture more easily. SIGNS AND SYMPTOMS The main symptoms of a broken finger are pain and swelling within 15 minutes after the injury. Other symptoms include:  Bruising of your finger.  Stiffness of your finger.  Numbness of your finger.  Exposed bones (compound fracture) if the fracture is severe. DIAGNOSIS  The best way to diagnose a broken bone is with X-ray imaging. Additionally, your health care provider will use this X-ray image to evaluate the position of the broken finger bones.  TREATMENT  Finger fractures can be treated with:   Nonreduction--This means the bones are in place. The finger is splinted without changing the positions of the bone pieces. The splint is usually left on for about a week to 10 days. This will depend on your fracture and what your health care provider thinks.  Closed reduction--The bones are put back into position without using surgery. The finger is then splinted.  Open reduction and internal fixation--The fracture site is opened. Then the bone pieces are fixed into place with pins or some type of hardware. This is seldom required. It depends on the severity of the fracture. HOME CARE INSTRUCTIONS   Follow your health care provider's instructions regarding activities,  exercises, and physical therapy.  Only take over-the-counter or prescription medicines for pain, discomfort, or fever as directed by your health care provider. SEEK MEDICAL CARE IF: You have pain or swelling that limits the motion or use of your fingers. SEEK IMMEDIATE MEDICAL CARE IF:  Your finger becomes numb. MAKE SURE YOU:   Understand these instructions.  Will watch your condition.  Will get help right away if you are not doing well or get worse.   This information is not intended to replace advice given to you by your health care provider. Make sure you discuss any questions you have with your health care provider.   Document Released: 11/09/2000 Document Revised: 05/18/2013 Document Reviewed: 03/09/2013 Elsevier Interactive Patient Education 2016 [employer].  Technical brewer It is common to have multiple bruises and sore muscles after a motor vehicle collision (MVC). These tend to feel worse for the first 24 hours. You may have the most stiffness and soreness over the first several hours. You may also feel worse when you wake up the first morning after your collision. After this point, you will usually begin to improve with each day. The  speed of improvement often depends on the severity of the collision, the number of injuries, and the location and nature of these injuries. HOME CARE INSTRUCTIONS  Put ice on the injured area.  Put ice in a plastic bag.  Place a towel between your skin and the bag.  Leave the ice on for 15-20 minutes, 3-4 times a day, or as directed by your health care provider.  Drink enough fluids to keep your urine clear or pale yellow. Do not drink alcohol.  Take a warm shower or bath once or twice a day. This will increase blood flow to sore muscles.  You may return to activities as directed by your caregiver. Be careful when lifting, as this may aggravate neck or back pain.  Only take over-the-counter or prescription medicines for pain,  discomfort, or fever as directed by your caregiver. Do not use aspirin. This may increase bruising and bleeding. SEEK IMMEDIATE MEDICAL CARE IF:  You have numbness, tingling, or weakness in the arms or legs.  You develop severe headaches not relieved with medicine.  You have severe neck pain, especially tenderness in the middle of the back of your neck.  You have changes in bowel or bladder control.  There is increasing pain in any area of the body.  You have shortness of breath, light-headedness, dizziness, or fainting.  You have chest pain.  You feel sick to your stomach (nauseous), throw up (vomit), or sweat.  You have increasing abdominal discomfort.  There is blood in your urine, stool, or vomit.  You have pain in your shoulder (shoulder strap areas).  You feel your symptoms are getting worse. MAKE SURE YOU:  Understand these instructions.  Will watch your condition.  Will get help right away if you are not doing well or get worse.   This information is not intended to replace advice given to you by your health care provider. Make sure you discuss any questions you have with your health care provider.   Document Released: 07/28/2005 Document Revised: 08/18/2014 Document Reviewed: 12/25/2010 Elsevier Interactive Patient Education [employer].

## 2015-12-28 ENCOUNTER — Ambulatory Visit (INDEPENDENT_AMBULATORY_CARE_PROVIDER_SITE_OTHER): Payer: BLUE CROSS/BLUE SHIELD | Admitting: Physician Assistant

## 2015-12-28 VITALS — BP 118/76 | HR 72 | Temp 98.0°F | Resp 16 | Wt 133.0 lb

## 2015-12-28 DIAGNOSIS — K130 Diseases of lips: Secondary | ICD-10-CM | POA: Diagnosis not present

## 2015-12-28 MED ORDER — NYSTATIN 100000 UNIT/GM EX OINT: 1.0000 "application " | TOPICAL_OINTMENT | Freq: Two times a day (BID) | CUTANEOUS | Status: DC

## 2015-12-28 NOTE — Progress Notes (Signed)
Patient ID: Cheryl Cooke, female   DOB: 01-28-1961, 55 y.o.   MRN: [id number]       [name] Female    DOB: 1960/11/21   55 y.o.   MRN: [id number] Visit Date: 12/28/2015  Today's Provider: Mar Daring, PA-C   Chief Complaint  Patient presents with  . Mouth Lesions   Subjective:    HPI  Patient states she noticed yesterday, May 18th, that her lips were red and puffy, also noticed corners of her mouth were cracked. Patient states that when she wipes or washes her mouth with soap and water it itches. She has not had any fever, no mouth pain or lesions in her mouth. She does mentions that she was on vacation from May 13th to may 17th and ate pineapple and strawberries 4 days in a row but never had this kind of reaction and is not sure if it is related. She also states that in February 2017 she came in with complaint of swollen lips and Simona Huh Chrismon diagnosed her with strep but she did not have any strep symptoms.    Allergies  Allergen Reactions  . Biaxin [Clarithromycin]   . Clarithromycin Nausea Only  . Levofloxacin     Other reaction(s): Nausea  . Shrimp [Shellfish Allergy] Swelling  . Sulfa Antibiotics Swelling   Previous Medications   AMOXICILLIN-CLAVULANATE (AUGMENTIN) 875-125 MG TABLET    Take 1 tablet by mouth 2 (two) times daily.   CALCIUM-VITAMIN D (OSCAL WITH D) 250-125 MG-UNIT PER TABLET    Take 1 tablet by mouth daily.    CYCLOBENZAPRINE (FLEXERIL) 5 MG TABLET    Take 1 tablet (5 mg total) by mouth every 8 (eight) hours as needed for muscle spasms.   ETANERCEPT (ENBREL) 50 MG/ML INJECTION    Inject 50 mg into the skin.    LEFLUNOMIDE (ARAVA) 20 MG TABLET    Take 20 mg by mouth daily.    MOMETASONE (ELOCON) 0.1 % CREAM       NAPROXEN (NAPROSYN) 500 MG TABLET    Take 1 tablet (500 mg total) by mouth 2 (two) times daily with a meal.    Review of Systems  Constitutional: Negative.   HENT: Positive for mouth sores. Negative for  congestion, facial swelling, postnasal drip, rhinorrhea, sinus pressure, sore throat and trouble swallowing.   Respiratory: Negative.   Cardiovascular: Negative.   Gastrointestinal: Negative.   Skin: Positive for rash.    Social History  Substance Use Topics  . Smoking status: Former Smoker -- 3 years    Quit date: 08/11/1983  . Smokeless tobacco: Never Used     Comment: was a social smoker  . Alcohol Use: Yes     Comment: Rare   Objective:   BP 118/76 mmHg  Pulse 72  Temp(Src) 98 F (36.7 C)  Resp 16  Wt 133 lb (60.328 kg)  Physical Exam  Constitutional: She appears well-developed and well-nourished. No distress.  HENT:  Head: Normocephalic and atraumatic.  Right Ear: Hearing, tympanic membrane, external ear and ear canal normal.  Left Ear: Hearing, tympanic membrane, external ear and ear canal normal.  Nose: Nose normal.  Mouth/Throat: Uvula is midline, oropharynx is clear and moist and mucous membranes are normal. Oral lesions present. No oropharyngeal exudate, posterior oropharyngeal edema or posterior oropharyngeal erythema.    Eyes: Conjunctivae are normal. Pupils are equal, round, and reactive to light. Right eye exhibits no discharge. Left eye exhibits no discharge. No scleral icterus.  Neck: Normal range of motion. Neck supple. No tracheal deviation present. No thyromegaly present.  Cardiovascular: Normal rate, regular rhythm and normal heart sounds.  Exam reveals no gallop and no friction rub.   No murmur heard. Pulmonary/Chest: Effort normal and breath sounds normal. No stridor. No respiratory distress. She has no wheezes. She has no rales.  Lymphadenopathy:    She has no cervical adenopathy.  Skin: Skin is warm and dry. She is not diaphoretic.  Vitals reviewed.       Assessment & Plan:     1. Angular cheilitis She has been putting Carmex on her lips to keep them moist. I will give her nystatin ointment as below for her to apply to the lips in the corners  of the mouth. She is to make sure to stay well-hydrated. She is to call the office if symptoms fail to improve or worsen. - nystatin ointment (MYCOSTATIN); Apply 1 application topically 2 (two) times daily.  Dispense: 30 g; Refill: 0       Mar Daring, PA-C  [facility]

## 2015-12-28 NOTE — Patient Instructions (Addendum)
*  Angular Cheilitis*  Nystatin skin cream or ointment What is this medicine? NYSTATIN (nye STAT in) is an antifungal medicine. It is used to treat certain kinds of fungal or yeast infections of the skin. This medicine may be used for other purposes; ask your health care provider or pharmacist if you have questions. What should I tell my health care provider before I take this medicine? They need to know if you have any of these conditions: -an unusual or allergic reaction to nystatin, other foods, dyes or preservatives -pregnant or trying to get pregnant -breast-feeding How should I use this medicine? This medicine is for external use on the skin only. Follow the directions on the prescription label. Wash hands before and after use. If treating a hand or nail infection, wash hands before use only. Apply a thin layer of this medicine to cover the affected skin and surrounding area. You can cover the area with a sterile gauze dressing (bandage). Do not use an airtight bandage (such as a plastic-covered bandage). Do not get the medicine in your eyes. If you do, rinse out with plenty of cool tap water. Use the full course of treatment prescribed, even if you think the infection is getting better. Use at regular intervals. Do not use your medicine more often than directed. Do not use this medicine for any condition other than the one for which it was prescribed. Talk to your pediatrician regarding the use of this medicine in children. Special care may be needed. Overdosage: If you think you have taken too much of this medicine contact a poison control center or emergency room at once. NOTE: This medicine is only for you. Do not share this medicine with others. What if I miss a dose? If you miss a dose, use it as soon as you can. If it is almost time for your next dose, use only that dose. Do not use double or extra doses. What may interact with this medicine? Interactions are not expected. Do not use  any other skin products on the affected area without telling your doctor or health care professional. This list may not describe all possible interactions. Give your health care provider a list of all the medicines, herbs, non-prescription drugs, or dietary supplements you use. Also tell them if you smoke, drink alcohol, or use illegal drugs. Some items may interact with your medicine. What should I watch for while using this medicine? Tell your doctor or health care professional if your symptoms do not improve after 3 days. After bathing make sure that your skin is very dry. Fungal infections like moist conditions. Do not walk around barefoot. To help prevent reinfection, wear freshly washed cotton, not synthetic, clothing. What side effects may I notice from receiving this medicine? Side effects that usually do not require medical attention (report to your doctor or health care professional if they continue or are bothersome): -skin irritation This list may not describe all possible side effects. Call your doctor for medical advice about side effects. You may report side effects to FDA at 1-800-FDA-1088. Where should I keep my medicine? Keep out of the reach of children. Store at room temperature 15 to 30 degrees C (59 to 86 degrees F). Throw away any unused medicine after the expiration date. NOTE: This sheet is a summary. It may not cover all possible information. If you have questions about this medicine, talk to your doctor, pharmacist, or health care provider.    2016, Elsevier/Gold Standard. (2008-02-16 15:40:45)

## 2016-01-04 ENCOUNTER — Other Ambulatory Visit: Payer: Self-pay | Admitting: Physician Assistant

## 2016-01-04 ENCOUNTER — Telehealth: Payer: Self-pay | Admitting: Physician Assistant

## 2016-01-04 DIAGNOSIS — K130 Diseases of lips: Principal | ICD-10-CM

## 2016-01-04 DIAGNOSIS — A499 Bacterial infection, unspecified: Secondary | ICD-10-CM

## 2016-01-04 MED ORDER — MUPIROCIN 2 % EX OINT: 1.0000 "application " | TOPICAL_OINTMENT | Freq: Two times a day (BID) | CUTANEOUS | Status: DC

## 2016-01-04 NOTE — Telephone Encounter (Signed)
Can we see if she is still using the nystatin ointment? If so may need to check labs to check B12.

## 2016-01-04 NOTE — Telephone Encounter (Signed)
Pt states that her lips got better but are now red,puffy again.She wants to know what she needs to do

## 2016-01-04 NOTE — Telephone Encounter (Signed)
Jenni spoke with patient.  Thanks,  -Delany Steury 

## 2016-02-08 ENCOUNTER — Ambulatory Visit (INDEPENDENT_AMBULATORY_CARE_PROVIDER_SITE_OTHER): Payer: BLUE CROSS/BLUE SHIELD | Admitting: Family Medicine

## 2016-02-08 ENCOUNTER — Encounter: Payer: Self-pay | Admitting: Family Medicine

## 2016-02-08 VITALS — BP 120/62 | HR 80 | Temp 97.9°F | Resp 16 | Wt 133.0 lb

## 2016-02-08 DIAGNOSIS — J069 Acute upper respiratory infection, unspecified: Secondary | ICD-10-CM

## 2016-02-08 MED ORDER — AMOXICILLIN-POT CLAVULANATE 875-125 MG PO TABS: 1.0000 | ORAL_TABLET | Freq: Two times a day (BID) | ORAL | Status: DC

## 2016-02-08 NOTE — Progress Notes (Signed)
Subjective:     Patient ID: Cheryl Cooke, female   DOB: Jan 07, 1961, 55 y.o.   MRN: [id number]  HPI  Chief Complaint  Patient presents with  . Cough    x 3 days  States she also has had sinus congestion with pressure. Cough is occasionally productive of greenish sputum. Has had chills on one occasion but no fever. Currently using a Neti Pot and gargling.   Review of Systems     Objective:   Physical Exam  Constitutional: She appears well-developed and well-nourished. No distress.  Ears: T.M's obscured by cerumen. Sinuses: Mild maxillary sinus tenderness Throat: no tonsillar enlargement or exudate Neck: no cervical adenopathy Lungs: clear     Assessment:    1. Upper respiratory infection - amoxicillin-clavulanate (AUGMENTIN) 875-125 MG tablet; Take 1 tablet by mouth 2 (two) times daily.  Dispense: 20 tablet; Refill: 0    Plan:    Discussed use of Claritin D and adding Benadryl at night for PND. To start abx if sinuses not improving over the next few days.

## 2016-02-08 NOTE — Patient Instructions (Signed)
Discussed adding Claritin D during the day and Benadryl at night for post nasal drainage. Continue Neti Pot.

## 2016-02-29 ENCOUNTER — Encounter: Payer: Self-pay | Admitting: Family Medicine

## 2016-02-29 ENCOUNTER — Ambulatory Visit (INDEPENDENT_AMBULATORY_CARE_PROVIDER_SITE_OTHER): Payer: Managed Care, Other (non HMO) | Admitting: Family Medicine

## 2016-02-29 ENCOUNTER — Other Ambulatory Visit: Payer: Self-pay | Admitting: Family Medicine

## 2016-02-29 VITALS — BP 104/70 | HR 80 | Temp 97.9°F | Resp 16 | Wt 134.0 lb

## 2016-02-29 DIAGNOSIS — N3090 Cystitis, unspecified without hematuria: Secondary | ICD-10-CM

## 2016-02-29 LAB — POCT URINALYSIS DIPSTICK
Bilirubin, UA: NEGATIVE
Glucose, UA: NEGATIVE
Ketones, UA: NEGATIVE
Leukocytes, UA: NEGATIVE
Nitrite, UA: NEGATIVE
PH UA: 6
Spec Grav, UA: >=1.03
UROBILINOGEN UA: 0.2

## 2016-02-29 MED ORDER — NITROFURANTOIN MONOHYD MACRO 100 MG PO CAPS: 100.0000 mg | ORAL_CAPSULE | Freq: Two times a day (BID) | ORAL | Status: DC

## 2016-02-29 NOTE — Patient Instructions (Signed)
We will call you with the culture results 

## 2016-02-29 NOTE — Progress Notes (Signed)
Subjective:     Patient ID: Cheryl Cooke, female   DOB: [birth year], 55 y.o.   MRN: [id number]  HPI  Chief Complaint  Patient presents with  . Urinary Tract Infection  States it has been a year since she has had a UTI. Has recently finished a course of Augmentin for a bronchitis.   Review of Systems  Constitutional: Negative for fever and chills.       Objective:   Physical Exam  Constitutional: She appears well-developed and well-nourished. No distress.  Genitourinary:  No CVA tenderness       Assessment:    1. Cystitis - POCT urinalysis dipstick - Urine culture - nitrofurantoin, macrocrystal-monohydrate, (MACROBID) 100 MG capsule; Take 1 capsule (100 mg total) by mouth 2 (two) times daily.  Dispense: 14 capsule; Refill: 0      Plan:    Further f/u pending urine culture.

## 2016-03-03 LAB — URINE CULTURE: Organism ID, Bacteria: NO GROWTH

## 2016-03-03 LAB — PLEASE NOTE

## 2016-03-07 ENCOUNTER — Telehealth: Payer: Self-pay | Admitting: *Deleted

## 2016-03-07 NOTE — Telephone Encounter (Signed)
Patient saw Mikki Santee 02/29/16 for a UTI. Patient stated that she finished the antibiotic yesterday. Patient stated that she is still having burning, frequency and a vaginal odor. Patient wanted be know if another rx for an antibiotic can be sent into pharmacy for her. [facility] [place]. [phone]

## 2016-03-07 NOTE — Telephone Encounter (Signed)
Urine culture did not grow an organism. This may be a vaginitis causing your symptoms. Would have to see you again to check this possibility and repeat your urine test.

## 2016-03-07 NOTE — Telephone Encounter (Signed)
Pt called back to get an update. Nurse was with pt. Since pt needed OV per Mikki Santee, I advised pt of Bob's message and scheduled f/u on Monday 03/10/16 @ 8:30 am. Thanks TNP

## 2016-03-10 ENCOUNTER — Ambulatory Visit (INDEPENDENT_AMBULATORY_CARE_PROVIDER_SITE_OTHER): Payer: Managed Care, Other (non HMO) | Admitting: Family Medicine

## 2016-03-10 ENCOUNTER — Encounter: Payer: Self-pay | Admitting: Family Medicine

## 2016-03-10 VITALS — BP 116/64 | HR 66 | Temp 97.6°F | Resp 16 | Wt 137.0 lb

## 2016-03-10 DIAGNOSIS — A6000 Herpesviral infection of urogenital system, unspecified: Secondary | ICD-10-CM | POA: Diagnosis not present

## 2016-03-10 DIAGNOSIS — L293 Anogenital pruritus, unspecified: Secondary | ICD-10-CM

## 2016-03-10 DIAGNOSIS — R350 Frequency of micturition: Secondary | ICD-10-CM

## 2016-03-10 DIAGNOSIS — L989 Disorder of the skin and subcutaneous tissue, unspecified: Secondary | ICD-10-CM

## 2016-03-10 LAB — POCT URINALYSIS DIPSTICK
Bilirubin, UA: NEGATIVE
Blood, UA: NEGATIVE
Glucose, UA: NEGATIVE
Ketones, UA: NEGATIVE
LEUKOCYTES UA: NEGATIVE
Nitrite, UA: NEGATIVE
PH UA: 6.5
PROTEIN UA: NEGATIVE
UROBILINOGEN UA: 0.2

## 2016-03-10 MED ORDER — VALACYCLOVIR HCL 500 MG PO TABS: 500.0000 mg | ORAL_TABLET | Freq: Two times a day (BID) | ORAL | 1 refills | Status: DC

## 2016-03-10 NOTE — Progress Notes (Signed)
Subjective:     Patient ID: Cheryl Cooke, female   DOB: September 19, 1960, 55 y.o.   MRN: [id number]  HPI  Chief Complaint  Patient presents with  . Follow-up    Patient returns to office for follow up visit, patient was last seen on 02/29/16 and diagnosed with cystitis and prescribed Macrobid 100mg . Urine culture collected showed no growth from last visit, patient reports after completing antibiotic Thursday she still has symptoms of perineal soreness and urinary frequency  States she does wear an incontinence pad and is not on suppressive treatment for HSV at this time. No vaginal discharge.   Review of Systems     Objective:   Physical Exam  Constitutional: She appears well-developed and well-nourished. No distress.  Genitourinary:  Genitourinary Comments: Tenderness localized just below her clitoris where there is mild inflammation       Assessment:    1. Urinary frequency - POCT urinalysis dipstick  2. Perineal irritation  3. Herpes progenitalis - valACYclovir (VALTREX) 500 MG tablet; Take 1 tablet (500 mg total) by mouth 2 (two) times daily.  Dispense: 6 tablet; Refill: 1    Plan:    Discussed use of abx ointment. Consider starting Valtrex if she feels/sees a herpetic lesion forming.

## 2016-03-10 NOTE — Patient Instructions (Signed)
Discussed use of antibiotic ointment for several days until irritation resolved.

## 2016-04-18 ENCOUNTER — Ambulatory Visit (INDEPENDENT_AMBULATORY_CARE_PROVIDER_SITE_OTHER): Payer: Managed Care, Other (non HMO) | Admitting: Family Medicine

## 2016-04-18 ENCOUNTER — Encounter: Payer: Self-pay | Admitting: Family Medicine

## 2016-04-18 VITALS — BP 106/54 | HR 68 | Temp 97.9°F | Resp 15 | Wt 134.2 lb

## 2016-04-18 DIAGNOSIS — S80811A Abrasion, right lower leg, initial encounter: Secondary | ICD-10-CM | POA: Diagnosis not present

## 2016-04-18 MED ORDER — SODIUM CHLORIDE 0.9 % EX SOLN: CUTANEOUS | 0 refills | Status: DC

## 2016-04-18 MED ORDER — CEPHALEXIN 500 MG PO CAPS: 500.0000 mg | ORAL_CAPSULE | Freq: Two times a day (BID) | ORAL | 0 refills | Status: DC

## 2016-04-18 NOTE — Progress Notes (Signed)
Subjective:     Patient ID: Cheryl Cooke, female   DOB: 08/20/1960, 55 y.o.   MRN: [id number]  HPI  Chief Complaint  Patient presents with  . Wound Check    Patient comes in office today concerned with possible skin infection to her right leg. Patient states that 6 weeks ago she scraped her leg on a laundry basket (pop up laundry basket with wire). Patient states that wire scraped across her leg and has since had an open wound. Patient reports that a scab recently formed around wound and when she pulled it off she began to have drainage, wound became larger and deeper and more painful. Pastient has tried otc Neosporin, Desitin, Peroxide and Tea tree oil.  Feels wound became wider after using an otc product. UTD with tetanus. Reports significant amount of non-purulent drainage appearing on the dressing and at times running down her leg.   Review of Systems     Objective:   Physical Exam  Constitutional: She appears well-developed and well-nourished. No distress.  Skin:  Right shin with abrasion, no pus with mild tenderness. Dressing has serous appearing drainage.       Assessment:    1. Abrasion of leg, right, initial encounter: slow healing due to immunosuppression and otc product irritation - cephALEXin (KEFLEX) 500 MG capsule; Take 1 capsule (500 mg total) by mouth 2 (two) times daily.  Dispense: 14 capsule; Refill: 0 - SODIUM CHLORIDE, EXTERNAL, ([facility] SALINE WOUND [place]) 0.9 % SOLN; Cleanse with saline solution daily then apply non stick dressing and frame with tape  Dispense: 210 mL; Refill: 0    Plan:    Cleanse wound daily with saline and apply dressing, framing with tape. Further f/u if not improving over the next two weeks.

## 2016-04-18 NOTE — Patient Instructions (Signed)
Only use saline solution on the wound. Let us know if you are not closing in the wound over the next two weeks.

## 2016-04-21 ENCOUNTER — Telehealth: Payer: Self-pay | Admitting: Family Medicine

## 2016-04-21 NOTE — Telephone Encounter (Signed)
Please review and advise. KW 

## 2016-04-21 NOTE — Telephone Encounter (Signed)
Let message may stop antibiotic if wound is resolving. If she feels that more abx is required asked her to call to discuss it.

## 2016-04-21 NOTE — Telephone Encounter (Signed)
Pt stated that she started taking the cephALEXin (KEFLEX) 500 MG capsule as directed on Friday 04/18/16 after her OV. Pt stated that she started to notice Saturday that she didn't feel so well but Sunday was worse. Pt stated that she had headache, dizziness, stomach pain, & nausea. Pt stated that she believes it is due to the medication. Pt stated that she went over the meds that she is allergic to in the office with Mikki Santee and the nurse. Pt stated that when she went to the pharmacy to pick up the medication the pharmacist advised her that the medication was in the same group as Biaxin which is listed that she is allergic to the medication. Pt stated that she went ahead and tried the medication b/c she knew Mikki Santee was aware of her allergy and since he is the "professional" she would go ahead and take the medication. Pt stated that she is stopping the medication b/c she can't handle the sick feeling. Pt stated that the last dose she took was this morning. Pt stated that the wound is looking and feeling better. [facility]: [facility]. Please advise. Thanks TNP

## 2016-05-09 ENCOUNTER — Encounter: Payer: BLUE CROSS/BLUE SHIELD | Admitting: Family Medicine

## 2016-05-16 ENCOUNTER — Encounter: Payer: Self-pay | Admitting: Family Medicine

## 2016-05-19 ENCOUNTER — Ambulatory Visit (INDEPENDENT_AMBULATORY_CARE_PROVIDER_SITE_OTHER): Payer: Managed Care, Other (non HMO) | Admitting: Family Medicine

## 2016-05-19 ENCOUNTER — Encounter: Payer: Self-pay | Admitting: Family Medicine

## 2016-05-19 VITALS — BP 120/70 | HR 84 | Temp 98.3°F | Resp 16 | Ht 63.0 in | Wt 132.0 lb

## 2016-05-19 DIAGNOSIS — Z01419 Encounter for gynecological examination (general) (routine) without abnormal findings: Secondary | ICD-10-CM | POA: Diagnosis not present

## 2016-05-19 DIAGNOSIS — M069 Rheumatoid arthritis, unspecified: Secondary | ICD-10-CM

## 2016-05-19 DIAGNOSIS — Z23 Encounter for immunization: Secondary | ICD-10-CM | POA: Diagnosis not present

## 2016-05-19 NOTE — Progress Notes (Signed)
[name], Female    DOB: Nov 12, 1960, 55 y.o.   MRN: [id number] Visit Date: 05/19/2016  Today's Provider: Lelon Huh, MD   Chief Complaint  Patient presents with  . Annual Exam  . Hyperlipidemia   Subjective:    Annual physical exam Cheryl Cooke is a 55 y.o. female who presents today for health maintenance and complete physical. She feels well. She reports exercising; gym. She reports she is sleeping well.  ----------------------------------------------------------------   Rheumatoid arthritis with positive rheumatoid factor, involving unspecified site: From 10/30/2015-Stable. Currently on Enbrel. Managed by rheumatology, Dr. Harrington Challenger.     Lipid/Cholesterol, Follow-up:   Last seen for this 05/09/2015.  Management since that visit includes; labs checked, no changes.  Last Lipid Panel:    Component Value Date/Time   CHOL 261 (H) 05/09/2015 1117   TRIG 166 (H) 05/09/2015 1117   HDL 73 05/09/2015 1117   LDLCALC 155 (H) 05/09/2015 1117    She reports good compliance with treatment. She is not having side effects. none  Wt Readings from Last 3 Encounters:  05/19/16 132 lb (59.9 kg)  04/18/16 134 lb 3.2 oz (60.9 kg)  03/10/16 137 lb (62.1 kg)    -----------------------------------------------------------------    Review of Systems  Constitutional: Negative.   HENT: Positive for congestion and sneezing.   Eyes: Negative.   Respiratory: Negative.   Cardiovascular: Negative.   Gastrointestinal: Positive for abdominal pain.  Genitourinary: Positive for urgency.  Musculoskeletal: Negative.   Skin: Positive for wound.  Allergic/Immunologic: Positive for environmental allergies and immunocompromised state.  Neurological: Negative.   Hematological: Negative.   Psychiatric/Behavioral: Negative.     Social History      She  reports that she quit smoking about 32 years ago. She quit after 3.00 years of use. She has never used smokeless  tobacco. She reports that she drinks alcohol. She reports that she does not use drugs.       Social History   Social History  . Marital status: Married    Spouse name: N/A  . Number of children: 1  . Years of education: H/S   Occupational History  . Massage Therapist    Social History Main Topics  . Smoking status: Former Smoker    Years: 3.00    Quit date: 08/11/1983  . Smokeless tobacco: Never Used     Comment: was a social smoker  . Alcohol use 0.0 oz/week     Comment: occasionally  . Drug use: No  . Sexual activity: Yes    Birth control/ protection: Post-menopausal   Other Topics Concern  . None   Social History Narrative  . None    Past Medical History:  Diagnosis Date  . Absolute anemia 02/26/2015   During pregnancy.   Marland Kitchen Dysplasia of cervix, low grade (CIN 1)    OBSERVATION  . H/O: vasectomy    HUSBAND HAS HAD VASECTOMY  . Herpes progenitalis      Patient Active Problem List   Diagnosis Date Noted  . Allergic rhinitis 02/26/2015  . Clinical depression 02/26/2015  . Accumulation of fluid in tissues 02/26/2015  . Acid reflux 02/26/2015  . Hypercholesteremia 02/26/2015  . IBS (irritable bowel syndrome) 02/26/2015  . Late luteal phase dysphoric disorder (LLPDD) 02/26/2015  . Arthritis or polyarthritis, rheumatoid ([place]) 02/26/2015  . 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 Dysplasia of cervix, low grade (CIN 1)   . Herpes progenitalis     Past Surgical History:  Procedure Laterality Date  . BREAST SURGERY Bilateral 1979   REDUCTION  . FOOT SURGERY  1979 AND 2002   RIGHT 1979, LEFT 2002; Bunion and hammertoes  . REDUCTION MAMMAPLASTY Bilateral 1978    Family History        Family Status  Relation Status  . Mother Deceased  . Father   . Maternal Grandmother   . Maternal Grandfather         Her family history includes Allergies in her mother; Alzheimer's disease in her mother; Cancer in her maternal grandmother; Diabetes in her mother;  Gout in her father; Heart attack in her father and mother; Heart disease in her father; Hypertension in her mother; Stroke in her maternal grandfather and mother.    Allergies  Allergen Reactions  . Biaxin [Clarithromycin]   . Clarithromycin Nausea Only  . Keflex [Cephalexin] Nausea Only    Dizziness and stomach pain  . Levofloxacin     Other reaction(s): Nausea  . Shrimp [Shellfish Allergy] Swelling  . Sulfa Antibiotics Swelling    Current Meds  Medication Sig  . calcium-vitamin D (OSCAL WITH D) 250-125 MG-UNIT per tablet Take 1 tablet by mouth daily.   Marland Kitchen etanercept (ENBREL) 50 MG/ML injection Inject 50 mg into the skin.   Marland Kitchen leflunomide (ARAVA) 20 MG tablet Take 20 mg by mouth daily.   . SODIUM CHLORIDE, EXTERNAL, ([facility]S SALINE WOUND [place]) 0.9 % SOLN Cleanse with saline solution daily then apply non stick dressing and frame with tape    Patient Care Team: Carmon Ginsberg, PA as PCP - General (Family Medicine) A. Glennie Isle, MD as Referring Physician (Rheumatology)     Objective:   Vitals: BP 120/70 (BP Location: Right Arm, Patient Position: Sitting, Cuff Size: Normal)   Pulse 84   Temp 98.3 F (36.8 C) (Oral)   Resp 16   Ht 5\' 3"  (1.6 m)   Wt 132 lb (59.9 kg)   BMI 23.38 kg/m    Physical Exam   General Appearance:    Alert, cooperative, no distress, appears stated age  Head:    Normocephalic, without obvious abnormality, atraumatic  Eyes:    PERRL, conjunctiva/corneas clear, EOM's intact, fundi    benign, both eyes  Ears:    Normal TM's and external ear canals, both ears  Nose:   Nares normal, septum midline, mucosa normal, no drainage    or sinus tenderness  Throat:   Lips, mucosa, and tongue normal; teeth and gums normal  Neck:   Supple, symmetrical, trachea midline, no adenopathy;    thyroid:  no enlargement/tenderness/nodules; no carotid   bruit or JVD  Back:     Symmetric, no curvature, ROM normal, no CVA tenderness  Lungs:     Clear to auscultation  bilaterally, respirations unlabored  Chest Wall:    No tenderness or deformity   Heart:    Regular rate and rhythm, S1 and S2 normal, no murmur, rub   or gallop  Breast Exam:    normal appearance, no masses or tenderness  Abdomen:     Soft, non-tender, bowel sounds active all four quadrants,    no masses, no organomegaly  Pelvic:    deferred  Extremities:   Extremities normal, atraumatic, no cyanosis or edema  Pulses:   2+ and symmetric all extremities  Skin:   Skin color, texture, turgor normal, no rashes or lesions  Lymph nodes:   Cervical, supraclavicular, and axillary nodes normal  Neurologic:   CNII-XII intact, normal strength, sensation  and reflexes    throughout    Depression Screen PHQ 2/9 Scores 05/19/2016 05/09/2015  PHQ - 2 Score 0 0  PHQ- 9 Score 0 -      Assessment & Plan:     Routine Health Maintenance and Physical Exam  Exercise Activities and Dietary recommendations Goals    . Exercise 150 minutes per week (moderate activity)       Immunization History  Administered Date(s) Administered  . Influenza,inj,Quad PF,36+ Mos 05/09/2015  . Tdap 04/18/2014    Health Maintenance  Topic Date Due  . HIV Screening  04/01/1975  . COLONOSCOPY  03/31/2010  . PAP SMEAR  07/20/2015  . INFLUENZA VACCINE  03/11/2016  . MAMMOGRAM  05/13/2018  . TETANUS/TDAP  04/18/2024  . Hepatitis C Screening  Completed      Discussed health benefits of physical activity, and encouraged her to engage in regular exercise appropriate for her age and condition.    --------------------------------------------------------------------  1. Well woman exam with routine gynecological exam Doing well. Send home with OC-Lyte - Basic metabolic panel - Lipid panel - Pap IG and HPV (high risk) DNA detection  2. Rheumatoid arthritis, involving unspecified site, unspecified rheumatoid factor presence ([place]) Continue routine follow up with endocrinology  3. Need for influenza  vaccination  - Flu Vaccine QUAD 36+ mos IM   Lelon Huh, MD  [facility]

## 2016-05-20 ENCOUNTER — Telehealth: Payer: Self-pay | Admitting: Family Medicine

## 2016-05-20 DIAGNOSIS — R21 Rash and other nonspecific skin eruption: Secondary | ICD-10-CM

## 2016-05-20 NOTE — Telephone Encounter (Signed)
Patient saw Dr. Caryn Section for a physical on 05/19/2016.  She would like to know if he could refer her to a dermatologist for right leg that bob saw her for on 04/18/2016.  She states that bob gave her a antibiotic for it and a wash for it and it has gotten better but not completely.  She states that it still itches and concerns her.  She states that she did not mention it to Dr. Caryn Section at her physical appointment because she read the physical consent form and thought she could not show him because it was not part of a physical.

## 2016-05-20 NOTE — Telephone Encounter (Signed)
Please advise 

## 2016-05-20 NOTE — Telephone Encounter (Signed)
Please refer derm for rash on leg.

## 2016-05-21 LAB — BASIC METABOLIC PANEL
BUN / CREAT RATIO: 23 (ref 9–23)
BUN: 19 mg/dL (ref 6–24)
CO2: 26 mmol/L (ref 18–29)
CREATININE: 0.82 mg/dL (ref 0.57–1.00)
Calcium: 10 mg/dL (ref 8.7–10.2)
Chloride: 103 mmol/L (ref 96–106)
GFR, EST AFRICAN AMERICAN: 93 mL/min/{1.73_m2} (ref >59)
GFR, EST NON AFRICAN AMERICAN: 80 mL/min/{1.73_m2} (ref >59)
GLUCOSE: 90 mg/dL (ref 65–99)
Potassium: 4.3 mmol/L (ref 3.5–5.2)
SODIUM: 142 mmol/L (ref 134–144)

## 2016-05-21 LAB — LIPID PANEL
CHOL/HDL RATIO: 3.4 ratio (ref 0.0–4.4)
Cholesterol, Total: 262 mg/dL — ABNORMAL HIGH (ref 100–199)
HDL: 76 mg/dL (ref >39)
LDL CALC: 165 mg/dL — AB (ref 0–99)
Triglycerides: 103 mg/dL (ref 0–149)
VLDL Cholesterol Cal: 21 mg/dL (ref 5–40)

## 2016-05-25 LAB — PAP IG AND HPV HIGH-RISK
HPV, HIGH-RISK: NEGATIVE
PAP Smear Comment: 0

## 2016-05-26 ENCOUNTER — Encounter: Payer: Self-pay | Admitting: Family Medicine

## 2016-05-30 ENCOUNTER — Ambulatory Visit (INDEPENDENT_AMBULATORY_CARE_PROVIDER_SITE_OTHER): Payer: Managed Care, Other (non HMO) | Admitting: Family Medicine

## 2016-05-30 ENCOUNTER — Encounter: Payer: Self-pay | Admitting: Family Medicine

## 2016-05-30 VITALS — BP 114/70 | HR 76 | Temp 98.0°F | Resp 16 | Wt 132.0 lb

## 2016-05-30 DIAGNOSIS — Z1212 Encounter for screening for malignant neoplasm of rectum: Secondary | ICD-10-CM

## 2016-05-30 DIAGNOSIS — L03115 Cellulitis of right lower limb: Secondary | ICD-10-CM | POA: Diagnosis not present

## 2016-05-30 DIAGNOSIS — Z1211 Encounter for screening for malignant neoplasm of colon: Secondary | ICD-10-CM

## 2016-05-30 LAB — IFOBT (OCCULT BLOOD): IFOBT: NEGATIVE

## 2016-05-30 MED ORDER — DOXYCYCLINE HYCLATE 100 MG PO TABS: 100.0000 mg | ORAL_TABLET | Freq: Two times a day (BID) | ORAL | 0 refills | Status: AC

## 2016-05-30 NOTE — Progress Notes (Signed)
[name] Female    DOB: 10-Nov-1960   55 y.o.   MRN: [id number] Visit Date: 05/30/2016  Today's Provider: Lelon Huh, MD   Chief Complaint  Patient presents with  . Wound Check   Subjective:    HPI Wound Check:  Patient comes in today stating the abrasion that she had about 2 months ago is still not healed. Patient has redness and itchiness around the wound. She states if she bumps it against a surface it begins to bleed.  Saw Mikki Santee in early September and prescribed cephalexin but stopped after a few days due to nausea. She resumed several days later but only took one a day to minimize side effects. She finished abx about 2 weeks ago and area remains dull red. Was draining some clear fluid, but that has stopped. Her rheumatologist told her she has to stop her biologicals until infection is completely resolved. Area is not painful. .     Allergies  Allergen Reactions  . Biaxin [Clarithromycin]   . Clarithromycin Nausea Only  . Keflex [Cephalexin] Nausea Only    Dizziness and stomach pain  . Levofloxacin     Other reaction(s): Nausea  . Shrimp [Shellfish Allergy] Swelling  . Sulfa Antibiotics Swelling     Current Outpatient Prescriptions:  .  calcium-vitamin D (OSCAL WITH D) 250-125 MG-UNIT per tablet, Take 1 tablet by mouth daily. , Disp: , Rfl:  .  etanercept (ENBREL) 50 MG/ML injection, Inject 50 mg into the skin. , Disp: , Rfl:  .  leflunomide (ARAVA) 20 MG tablet, Take 20 mg by mouth daily. , Disp: , Rfl:   Review of Systems  Constitutional: Negative for appetite change, chills, fatigue and fever.  Respiratory: Negative for chest tightness and shortness of breath.   Cardiovascular: Negative for chest pain and palpitations.  Gastrointestinal: Negative for abdominal pain, nausea and vomiting.  Skin: Positive for wound.       Itchy skin  Neurological: Negative for dizziness and weakness.    Social History  Substance Use Topics  . Smoking  status: Former Smoker    Years: 3.00    Quit date: 08/11/1983  . Smokeless tobacco: Never Used     Comment: was a social smoker  . Alcohol use 0.0 oz/week     Comment: occasionally   Objective:   BP 114/70 (BP Location: Left Arm, Patient Position: Sitting, Cuff Size: Normal)   Pulse 76   Temp 98 F (36.7 C) (Oral)   Resp 16   Wt 132 lb (59.9 kg)   SpO2 96% Comment: room air  BMI 23.38 kg/m   Physical Exam   General Appearance:    Alert, cooperative, no distress  Eyes:    PERRL, conjunctiva/corneas clear, EOM's intact       Lungs:     Clear to auscultation bilaterally, respirations unlabored  Heart:    Regular rate and rhythm  Neurologic:   Awake, alert, oriented x 3. No apparent focal neurological           defect.   Skin:  About 4x3cm irregular, but well demarcated area of dull erythema right anterior lower leg that is slightly warm to touch compared to surrounding area. Not tender.        Assessment & Plan:     1. Cellulitis of right lower extremity Partially treated with cephalexin which she did not tolerate well. Slow resolution due to immunosuppressed state. Marked edges or erythematous area and start  2 week doxycycline. Recheck in about 2 weeks.  - doxycycline (VIBRA-TABS) 100 MG tablet; Take 1 tablet (100 mg total) by mouth 2 (two) times daily.  Dispense: 28 tablet; Refill: 0       Lelon Huh, MD  [facility]

## 2016-06-13 ENCOUNTER — Encounter: Payer: Self-pay | Admitting: Family Medicine

## 2016-06-13 ENCOUNTER — Ambulatory Visit (INDEPENDENT_AMBULATORY_CARE_PROVIDER_SITE_OTHER): Payer: Managed Care, Other (non HMO) | Admitting: Family Medicine

## 2016-06-13 VITALS — BP 130/68 | HR 77 | Temp 97.8°F | Resp 16 | Ht 63.0 in | Wt 134.0 lb

## 2016-06-13 DIAGNOSIS — L03115 Cellulitis of right lower limb: Secondary | ICD-10-CM

## 2016-06-13 NOTE — Telephone Encounter (Signed)
error 

## 2016-06-13 NOTE — Progress Notes (Signed)
       [name] Female    DOB: 12/13/1960   55 y.o.   MRN: [id number] Visit Date: 06/13/2016  Today's Provider: Lelon Huh, MD   Chief Complaint  Patient presents with  . Follow-up  . Cellulitis   Subjective:    Patient states wound is better and she has finished antibiotic.    Cellulitis of right lower extremity From 05/30/2016-Partially treated with cephalexin which she did not tolerate well. Slow resolution due to immunosuppressed state. Marked edges or erythematous area and start 2 week doxycycline. Recheck in about 2 weeks.     Allergies  Allergen Reactions  . Biaxin [Clarithromycin]   . Clarithromycin Nausea Only  . Keflex [Cephalexin] Nausea Only    Dizziness and stomach pain  . Levofloxacin     Other reaction(s): Nausea  . Shrimp [Shellfish Allergy] Swelling  . Sulfa Antibiotics Swelling     Current Outpatient Prescriptions:  .  calcium-vitamin D (OSCAL WITH D) 250-125 MG-UNIT per tablet, Take 1 tablet by mouth daily. , Disp: , Rfl:  .  etanercept (ENBREL) 50 MG/ML injection, Inject 50 mg into the skin. , Disp: , Rfl:  .  leflunomide (ARAVA) 20 MG tablet, Take 20 mg by mouth daily. , Disp: , Rfl:  .  doxycycline (VIBRA-TABS) 100 MG tablet, Take 1 tablet (100 mg total) by mouth 2 (two) times daily. (Patient not taking: Reported on 06/13/2016), Disp: 28 tablet, Rfl: 0  Review of Systems  Constitutional: Negative for appetite change, chills, fatigue and fever.  Respiratory: Negative for chest tightness and shortness of breath.   Cardiovascular: Negative for chest pain and palpitations.  Gastrointestinal: Negative for abdominal pain, nausea and vomiting.  Neurological: Negative for dizziness and weakness.    Social History  Substance Use Topics  . Smoking status: Former Smoker    Years: 3.00    Quit date: 08/11/1983  . Smokeless tobacco: Never Used     Comment: was a social smoker  . Alcohol use 0.0 oz/week     Comment: occasionally     Objective:   BP 130/68 (BP Location: Right Arm, Patient Position: Sitting, Cuff Size: Normal)   Pulse 77   Temp 97.8 F (36.6 C) (Oral)   Resp 16   Ht 5\' 3"  (1.6 m)   Wt 134 lb (60.8 kg)   SpO2 99%   BMI 23.74 kg/m   Physical Exam  Well defined bronze discoloration right anterior leg smaller in size than erythematous area of last visit and not at all warm to touch.     Assessment & Plan:     1. Cellulitis of right lower extremity Resolved, has completed abx. Will resume Enbrel. She is to check leg daily and call if any change in color, temperature or pain.        Lelon Huh, MD  [facility]

## 2016-06-25 ENCOUNTER — Ambulatory Visit (INDEPENDENT_AMBULATORY_CARE_PROVIDER_SITE_OTHER): Payer: Managed Care, Other (non HMO) | Admitting: Family Medicine

## 2016-06-25 ENCOUNTER — Encounter: Payer: Self-pay | Admitting: Family Medicine

## 2016-06-25 VITALS — BP 110/70 | HR 78 | Temp 97.5°F | Resp 16 | Ht 63.0 in | Wt 133.0 lb

## 2016-06-25 DIAGNOSIS — N3000 Acute cystitis without hematuria: Secondary | ICD-10-CM | POA: Diagnosis not present

## 2016-06-25 DIAGNOSIS — R300 Dysuria: Secondary | ICD-10-CM

## 2016-06-25 LAB — POCT URINALYSIS DIPSTICK
BILIRUBIN UA: NEGATIVE
GLUCOSE UA: NEGATIVE
Ketones, UA: NEGATIVE
NITRITE UA: NEGATIVE
Spec Grav, UA: 1.02
Urobilinogen, UA: 0.2
pH, UA: 6.5

## 2016-06-25 MED ORDER — CIPROFLOXACIN HCL 500 MG PO TABS: 500.0000 mg | ORAL_TABLET | Freq: Two times a day (BID) | ORAL | 0 refills | Status: DC

## 2016-06-25 NOTE — Progress Notes (Signed)
[name] Female    DOB: [birth year]   55 y.o.   MRN: [id number] Visit Date: 06/25/2016  Today's Provider: Lelon Huh, MD   Chief Complaint  Patient presents with  . Dysuria   Subjective:    Patient has burning and frequency that started yesterday. Also has pelvic pain and oder. No fever, no discharge.   Dysuria   This is a new problem. The current episode started yesterday. The problem occurs every urination. The problem has been gradually worsening. The quality of the pain is described as burning. The pain is moderate. There has been no fever. Associated symptoms include chills, a discharge, frequency and urgency. Pertinent negatives include no flank pain, hematuria, hesitancy, nausea, possible pregnancy, sweats or vomiting. She has tried nothing for the symptoms.       Allergies  Allergen Reactions  . Biaxin [Clarithromycin]   . Clarithromycin Nausea Only  . Keflex [Cephalexin] Nausea Only    Dizziness and stomach pain  . Levofloxacin     Other reaction(s): Nausea  . Shrimp [Shellfish Allergy] Swelling  . Sulfa Antibiotics Swelling     Current Outpatient Prescriptions:  .  calcium-vitamin D (OSCAL WITH D) 250-125 MG-UNIT per tablet, Take 1 tablet by mouth daily. , Disp: , Rfl:  .  etanercept (ENBREL) 50 MG/ML injection, Inject 50 mg into the skin. , Disp: , Rfl:  .  leflunomide (ARAVA) 20 MG tablet, Take 20 mg by mouth daily. , Disp: , Rfl:   Review of Systems  Constitutional: Positive for chills. Negative for appetite change, fatigue and fever.  Respiratory: Negative for chest tightness and shortness of breath.   Cardiovascular: Negative for chest pain and palpitations.  Gastrointestinal: Negative for abdominal pain, nausea and vomiting.  Genitourinary: Positive for dysuria, frequency, pelvic pain and urgency. Negative for flank pain, hematuria and hesitancy.  Neurological: Negative for dizziness and weakness.    Social History    Substance Use Topics  . Smoking status: Former Smoker    Years: 3.00    Quit date: 08/11/1983  . Smokeless tobacco: Never Used     Comment: was a social smoker  . Alcohol use 0.0 oz/week     Comment: occasionally   Objective:   BP 110/70 (BP Location: Right Arm, Patient Position: Sitting, Cuff Size: Normal)   Pulse 78   Temp 97.5 F (36.4 C) (Oral)   Resp 16   Ht 5\' 3"  (1.6 m)   Wt 133 lb (60.3 kg)   SpO2 99%   BMI 23.56 kg/m   Physical Exam  General appearance: alert, well developed, well nourished, cooperative and in no distress Head: Normocephalic, without obvious abnormality, atraumatic Respiratory: Respirations even and unlabored, normal respiratory rate   Results for orders placed or performed in visit on 06/25/16  POCT urinalysis dipstick  Result Value Ref Range   Color, UA Yellow    Clarity, UA Cloudy    Glucose, UA Neg    Bilirubin, UA Neg    Ketones, UA Neg    Spec Grav, UA 1.020    Blood, UA Large+++    pH, UA 6.5    Protein, UA Trace    Urobilinogen, UA 0.2    Nitrite, UA Neg    Leukocytes, UA large (3+) (A) Negative       Assessment & Plan:     1. Dysuria  - POCT urinalysis dipstick - Urine culture  2. Acute cystitis without hematuria  - Urine culture -  ciprofloxacin (CIPRO) 500 MG tablet; Take 1 tablet (500 mg total) by mouth 2 (two) times daily.  Dispense: 14 tablet; Refill: 0     The entirety of the information documented in the History of Present Illness, Review of Systems and Physical Exam were personally obtained by me. Portions of this information were initially documented by Theressa Millard, CMA and reviewed by me for thoroughness and accuracy.    Lelon Huh, MD  [facility]

## 2016-06-26 ENCOUNTER — Telehealth: Payer: Self-pay | Admitting: Family Medicine

## 2016-06-26 NOTE — Telephone Encounter (Signed)
Pt stated that she was in for OV yesterday 06/25/16 and started taking the ciprofloxacin (CIPRO) 500 MG tablet [25119] Pt stated she took the first dose yesterday after getting the RX, last night , again this morning. This morning she has had a increase in pelvic discomfort and when she voiding she noticed some blood in her urine. Pt wanted to up date Mikki Santee on her symptoms and she if she needed to do any thing different or just continue medication as directed. Please advise. Thanks TNP

## 2016-06-26 NOTE — Telephone Encounter (Signed)
Please review. KW 

## 2016-06-26 NOTE — Telephone Encounter (Signed)
Patient has been advised. KW 

## 2016-06-26 NOTE — Telephone Encounter (Signed)
See message.

## 2016-06-26 NOTE — Telephone Encounter (Signed)
Usually takes 24 hours for antibiotic to help. Continue cipro for now. We should have culture results back tomorrow and will know if any changes need to be made.

## 2016-06-27 ENCOUNTER — Other Ambulatory Visit: Payer: Self-pay | Admitting: Family Medicine

## 2016-06-27 ENCOUNTER — Telehealth: Payer: Self-pay

## 2016-06-27 LAB — URINE CULTURE

## 2016-06-27 MED ORDER — AMOXICILLIN 500 MG PO CAPS: 500.0000 mg | ORAL_CAPSULE | Freq: Two times a day (BID) | ORAL | 0 refills | Status: DC

## 2016-06-27 MED ORDER — AMOXICILLIN 500 MG PO CAPS: 1000.0000 mg | ORAL_CAPSULE | Freq: Two times a day (BID) | ORAL | 0 refills | Status: AC

## 2016-06-27 NOTE — Telephone Encounter (Signed)
Patient was seen in office on 06/25/16 for Dysuria patient as prescribed Cipro 500mg . Today patient states that she has had pain in her LLQ that she describes as cramping and pain in her lower back on the right side. Patient reports she been taking antibiotic since Wednesday and she states it it not helping with symptoms. Patient wants to know if this is a possible kidney infection she is having? And also wanted to know if you received urine culture results back yet? KW

## 2016-06-27 NOTE — Telephone Encounter (Signed)
Patient has been advised and new prescription was sent into pharmacy. KW

## 2016-06-27 NOTE — Telephone Encounter (Signed)
Urine culture is back and shows infection with Proteus. It is resistant to several antibiotics, but is sensitive to cephalexin and amoxicillin. Recommend change to amoxicillin 500mg  two tablets twice daily for 7 days.

## 2016-07-07 ENCOUNTER — Telehealth: Payer: Self-pay | Admitting: Family Medicine

## 2016-07-07 MED ORDER — CIPROFLOXACIN HCL 500 MG PO TABS
500.0000 mg | ORAL_TABLET | Freq: Two times a day (BID) | ORAL | 0 refills | Status: AC
Start: 2016-07-07 — End: 2016-07-15

## 2016-07-07 NOTE — Telephone Encounter (Signed)
Pt stated that she has been taking amoxicillin (AMOXIL) 500 MG capsule as directed and she will get some relief for a day or so and then she will have an episode and she has discomfort and pain in her vaginal and kidney area. Pt wanted to see if she should come back in or try something else. Please advise. Thanks TNP

## 2016-07-07 NOTE — Telephone Encounter (Signed)
Patient was notified. Scheduled appt for 4:15 tomorrow.

## 2016-07-07 NOTE — Telephone Encounter (Signed)
Actually, she should come in so we can run another culture. She didn't seem to improve when cipro was prescribed last time even though the culture showed infection being sensitive. The last culture was resistant to several other antibiotics, so we really need to recheck to see if sensitivities have changed.

## 2016-07-07 NOTE — Telephone Encounter (Signed)
Advised patient as below. Patient reports that you initially prescribed Cipro for her infection on 06/27/2016 until her urine cx came back sensitive to the Cipro and you changed it to Amoxicillin. Patient is wanting to know should she still take the cipro since this was changed to a different abx? Pharmacy we have listed is correct. Please advise. Thanks!

## 2016-07-07 NOTE — Telephone Encounter (Signed)
Please advise. KW 

## 2016-07-07 NOTE — Telephone Encounter (Signed)
Have sent new prescription for Cipro to [facility]. If not better when finished then she will need to return for recheck.

## 2016-07-08 ENCOUNTER — Ambulatory Visit (INDEPENDENT_AMBULATORY_CARE_PROVIDER_SITE_OTHER): Payer: Managed Care, Other (non HMO) | Admitting: Family Medicine

## 2016-07-08 ENCOUNTER — Encounter: Payer: Self-pay | Admitting: Family Medicine

## 2016-07-08 VITALS — BP 112/72 | HR 94 | Temp 97.9°F | Resp 16 | Wt 135.0 lb

## 2016-07-08 DIAGNOSIS — M545 Low back pain, unspecified: Secondary | ICD-10-CM

## 2016-07-08 DIAGNOSIS — N760 Acute vaginitis: Secondary | ICD-10-CM | POA: Diagnosis not present

## 2016-07-08 DIAGNOSIS — N3000 Acute cystitis without hematuria: Secondary | ICD-10-CM

## 2016-07-08 LAB — POCT URINALYSIS DIPSTICK
BILIRUBIN UA: NEGATIVE
GLUCOSE UA: NEGATIVE
Ketones, UA: NEGATIVE
NITRITE UA: NEGATIVE
PH UA: 6
Protein, UA: NEGATIVE
Spec Grav, UA: 1.025
Urobilinogen, UA: 0.2

## 2016-07-08 NOTE — Progress Notes (Signed)
[name] Female    DOB: 1961-04-28   55 y.o.   MRN: [id number] Visit Date: 07/08/2016  Today's Provider: Lelon Huh, MD   Chief Complaint  Patient presents with  . Urinary Tract Infection    recheck   Subjective:    Urinary Tract Infection   This is a recurrent problem. Episode onset: Patient was last seen 06/25/2016 for cystitis. Patient was treated with Cipro and was later changed to Amoxicillin after she developed hematuria. Associated symptoms include frequency and urgency. Pertinent negatives include no chills, nausea or vomiting.  Patient comes in today stating her symptoms have not improved since the last visit. She is now having swollen sensation in clitoral area and has had intermittent pain right lower back. Urine cultures from last visit grew proteus. She states she continues to have occasional episode of blood of urine.     Allergies  Allergen Reactions  . Biaxin [Clarithromycin]   . Clarithromycin Nausea Only  . Keflex [Cephalexin] Nausea Only    Dizziness and stomach pain  . Levofloxacin     Other reaction(s): Nausea  . Shrimp [Shellfish Allergy] Swelling  . Sulfa Antibiotics Swelling     Current Outpatient Prescriptions:  .  calcium-vitamin D (OSCAL WITH D) 250-125 MG-UNIT per tablet, Take 1 tablet by mouth daily. , Disp: , Rfl:  .  etanercept (ENBREL) 50 MG/ML injection, Inject 50 mg into the skin. , Disp: , Rfl:  .  leflunomide (ARAVA) 20 MG tablet, Take 20 mg by mouth daily. , Disp: , Rfl:  .  ciprofloxacin (CIPRO) 500 MG tablet, Take 1 tablet (500 mg total) by mouth 2 (two) times daily. (Patient not taking: Reported on 07/08/2016), Disp: 14 tablet, Rfl: 0  Review of Systems  Constitutional: Positive for fatigue. Negative for appetite change, chills and fever.  Respiratory: Negative for chest tightness and shortness of breath.   Cardiovascular: Negative for chest pain and palpitations.  Gastrointestinal: Positive for abdominal  pain and diarrhea. Negative for nausea and vomiting.  Genitourinary: Positive for frequency and urgency.       Clitoral pain  Musculoskeletal: Positive for back pain.  Neurological: Negative for dizziness and weakness.    Social History  Substance Use Topics  . Smoking status: Former Smoker    Years: 3.00    Quit date: 08/11/1983  . Smokeless tobacco: Never Used     Comment: was a social smoker  . Alcohol use 0.0 oz/week     Comment: occasionally   Objective:   Pulse 94   Temp 97.9 F (36.6 C) (Oral)   Resp 16   Wt 135 lb (61.2 kg)   SpO2 96% Comment: room air  BMI 23.91 kg/m   Physical Exam  General appearance: alert, well developed, well nourished, cooperative and in no distress Head: Normocephalic, without obvious abnormality, atraumatic Respiratory: Respirations even and unlabored, normal respiratory rate GU: No CVA tenderness.   Results for orders placed or performed in visit on 07/08/16  POCT Urinalysis Dipstick  Result Value Ref Range   Color, UA yellow    Clarity, UA clear    Glucose, UA negative    Bilirubin, UA negative    Ketones, UA negative    Spec Grav, UA 1.025    Blood, UA trace (non hemolyzed)    pH, UA 6.0    Protein, UA negative    Urobilinogen, UA 0.2    Nitrite, UA negative    Leukocytes, UA Trace (A)  Negative       Assessment & Plan:     1. Acute cystitis without hematuria Today's u/a not suggestive of persistent UTI. She is to continue amoxil while awaiting urine cultures.  - POCT Urinalysis Dipstick - Urine Culture  2. Vulvovaginitis I think this is unrelated to cystis. She has similar sensation of swollen area around clitoris in July and improved with neosporin, which she is going to try again. She also has h/o HSV and advised to start on valacyclovir if not improving after a few days of neosporin. Will consider gyn referral.   3. Acute right-sided low back pain without sciatica Doubt this is related to UTI. Is better today.  History is more consistent with MS pain.        Lelon Huh, MD  [facility]

## 2016-07-10 LAB — URINE CULTURE: Organism ID, Bacteria: NO GROWTH

## 2016-07-11 ENCOUNTER — Telehealth: Payer: Self-pay

## 2016-07-11 DIAGNOSIS — R310 Gross hematuria: Secondary | ICD-10-CM

## 2016-07-11 NOTE — Telephone Encounter (Signed)
Pt advised and she would like to proceed with referral, she is still having issues-aa

## 2016-07-11 NOTE — Telephone Encounter (Signed)
Please review-aa 

## 2016-07-11 NOTE — Telephone Encounter (Signed)
-----   Message from Birdie Sons, MD sent at 07/11/2016  2:58 PM EST ----- Urine culture is normal, no sign of infection. If is still having urinary symptoms or blood in urine then she needs referral to urology for further evaluation.

## 2016-07-11 NOTE — Telephone Encounter (Signed)
Please refer urology for persistent hematuria and dysuria.

## 2016-07-16 ENCOUNTER — Encounter: Payer: Self-pay | Admitting: Urology

## 2016-07-16 ENCOUNTER — Ambulatory Visit: Payer: Managed Care, Other (non HMO) | Admitting: Urology

## 2016-07-16 VITALS — BP 117/74 | HR 86 | Ht 63.0 in | Wt 133.0 lb

## 2016-07-16 DIAGNOSIS — N390 Urinary tract infection, site not specified: Secondary | ICD-10-CM

## 2016-07-16 DIAGNOSIS — R310 Gross hematuria: Secondary | ICD-10-CM | POA: Diagnosis not present

## 2016-07-16 DIAGNOSIS — R350 Frequency of micturition: Secondary | ICD-10-CM

## 2016-07-16 LAB — URINALYSIS, COMPLETE
BILIRUBIN UA: NEGATIVE
Glucose, UA: NEGATIVE
Ketones, UA: NEGATIVE
Nitrite, UA: NEGATIVE
PH UA: 6 (ref 5.0–7.5)
PROTEIN UA: NEGATIVE
RBC UA: NEGATIVE
Specific Gravity, UA: 1.02 (ref 1.005–1.030)
Urobilinogen, Ur: 0.2 mg/dL (ref 0.2–1.0)

## 2016-07-16 LAB — MICROSCOPIC EXAMINATION

## 2016-07-16 LAB — BLADDER SCAN AMB NON-IMAGING

## 2016-07-16 MED ORDER — PHENAZOPYRIDINE HCL 200 MG PO TABS: 200.0000 mg | ORAL_TABLET | Freq: Three times a day (TID) | ORAL | 1 refills | Status: DC | PRN

## 2016-07-16 MED ORDER — OXYBUTYNIN CHLORIDE 5 MG PO TABS: 5.0000 mg | ORAL_TABLET | Freq: Three times a day (TID) | ORAL | 0 refills | Status: DC | PRN

## 2016-07-16 NOTE — Progress Notes (Signed)
See consult note

## 2016-07-16 NOTE — Patient Instructions (Signed)
Cranberry tabs twice daily  Probiotics vs. Yogurt with live active culture  Ditropan (bladder spasms / leakage/ urgency/ frequency) up three times a day as needed  Pyridium for burning (take no more than 3 days at time)  Return if not improved in 2-3 weeks

## 2016-07-16 NOTE — Progress Notes (Signed)
07/16/2016 11:30 AM   [place] Jul 26, 1961 [id number]  Referring provider: Carmon Ginsberg, [place] [address] [place], [place] [address]  Chief Complaint  Patient presents with  . Hematuria    New Patient    HPI: 55 year old female who presents today with ongoing urinary frequency, urgency, suprapubic pain, and dysuria following a recent urinary tract infection.   She was seen and evaluated by Dr. Caryn Section on 06/27/2016 found to have 100,000 colonies of Proteus in her urine. She was treated initially with Cipro for 3 days but developed nausea related to this medication and was switched to Augmentin for a total of 2 weeks.     During her treatment course, her symptoms actually worsened and she developed an episode of gross hematuria along with low back pain. This portion resolved but she continues to have persistent urinary frequency, urgency, and suprapubic tenderness.  Her urine was rechecked on 07/08/2016 just prior to completing antibiotics given her ongoing symptoms. This urine culture was negative for growth.  Today, she continues to have the same symptoms although improving slightly. She denies any fevers or chills. No ongoing gross hematuria although she does have some terminal dysuria.  She also had a few episodes of urge incontinence which is new for her.  She has a remote history of recurrent urinary tract infections, previously saw Dr. Noland Fordyce off around 2007 for these infections. Renal ultrasound at that time was unremarkable. Since then, she had very few infections.  She is menopausal. She is sexually active. She does have some vaginal dryness.  Denies personal history of flank pain or kidney stones.   PMH: Past Medical History:  Diagnosis Date  . Absolute anemia 02/26/2015   During pregnancy.   . Anxiety   . Arthritis   . Depression   . Dysplasia of cervix, low grade (CIN 1)    OBSERVATION  . H/O: vasectomy    HUSBAND HAS HAD VASECTOMY  . Herpes  progenitalis   . Hyperlipemia     Surgical History: Past Surgical History:  Procedure Laterality Date  . BREAST SURGERY Bilateral 1979   REDUCTION  . FOOT SURGERY  1979 AND 2002   RIGHT 1979, LEFT 2002; Bunion and hammertoes  . REDUCTION MAMMAPLASTY Bilateral 1978    Home Medications:    Medication List       Accurate as of 07/16/16 11:30 AM. Always use your most recent med list.          ASTAXANTHIN PO Take by mouth.   calcium-vitamin D 250-125 MG-UNIT tablet Commonly known as:  OSCAL WITH D Take 1 tablet by mouth daily.   ENBREL 50 MG/ML injection Generic drug:  etanercept Inject 50 mg into the skin.   leflunomide 20 MG tablet Commonly known as:  ARAVA Take 20 mg by mouth daily.   oxybutynin 5 MG tablet Commonly known as:  DITROPAN Take 1 tablet (5 mg total) by mouth every 8 (eight) hours as needed for bladder spasms.   phenazopyridine 200 MG tablet Commonly known as:  PYRIDIUM Take 1 tablet (200 mg total) by mouth 3 (three) times daily as needed for pain.       Allergies:  Allergies  Allergen Reactions  . Biaxin [Clarithromycin]   . Clarithromycin Nausea Only  . Keflex [Cephalexin] Nausea Only    Dizziness and stomach pain  . Levofloxacin     Other reaction(s): Nausea  . Shrimp [Shellfish Allergy] Swelling  . Sulfa Antibiotics Swelling    Family History:  Family History  Problem Relation Age of Onset  . Diabetes Mother   . Hypertension Mother   . Alzheimer's disease Mother   . Allergies Mother   . Stroke Mother   . Heart attack Mother   . Heart disease Father   . Gout Father   . Heart attack Father   . Cancer Maternal Grandmother     UTERINE CANCER  . Stroke Maternal Grandfather   . Bladder Cancer Neg Hx   . Kidney cancer Neg Hx   . Prostate cancer Neg Hx     Social History:  reports that she quit smoking about 32 years ago. She quit after 3.00 years of use. She has never used smokeless tobacco. She reports that she drinks alcohol.  She reports that she does not use drugs.  ROS: UROLOGY Frequent Urination?: Yes Hard to postpone urination?: Yes Burning/pain with urination?: Yes Get up at night to urinate?: Yes Leakage of urine?: Yes Urine stream starts and stops?: No Trouble starting stream?: No Do you have to strain to urinate?: No Blood in urine?: No Urinary tract infection?: No Sexually transmitted disease?: Yes Injury to kidneys or bladder?: Yes Painful intercourse?: No Weak stream?: No Currently pregnant?: No Vaginal bleeding?: No Last menstrual period?: n  Gastrointestinal Nausea?: Yes Vomiting?: No Indigestion/heartburn?: No Diarrhea?: Yes Constipation?: No  Constitutional Fever: No Night sweats?: No Weight loss?: No Fatigue?: No  Skin Skin rash/lesions?: Yes Itching?: Yes  Eyes Blurred vision?: No Double vision?: No  Ears/Nose/Throat Sore throat?: Yes Sinus problems?: Yes  Hematologic/Lymphatic Swollen glands?: No Easy bruising?: No  Cardiovascular Leg swelling?: No Chest pain?: No  Respiratory Cough?: No Shortness of breath?: No  Endocrine Excessive thirst?: No  Musculoskeletal Back pain?: Yes Joint pain?: Yes  Neurological Headaches?: No Dizziness?: No  Psychologic Depression?: No Anxiety?: No  Physical Exam: BP 117/74   Pulse 86   Ht 5\' 3"  (1.6 m)   Wt 133 lb (60.3 kg)   BMI 23.56 kg/m   Constitutional:  Alert and oriented, No acute distress. HEENT: [place] AT, moist mucus membranes.  Trachea midline, no masses. Cardiovascular: No clubbing, cyanosis, or edema. Respiratory: Normal respiratory effort, no increased work of breathing. GI: Abdomen is soft, nontender, nondistended, no abdominal masses GU: No CVA tenderness.  Skin: No rashes, bruises or suspicious lesions. Neurologic: Grossly intact, no focal deficits, moving all 4 extremities. Psychiatric: Normal mood and affect.  Laboratory Data: Lab Results  Component Value Date   WBC 6.5 05/09/2015     HCT 36.9 05/09/2015   MCV 90 05/09/2015   PLT 255 05/09/2015    Lab Results  Component Value Date   CREATININE 0.82 05/20/2016     Urinalysis UA today with 10-30 white blood cells, 3-10 red blood cells, nitrite negative. Few bacteria present. See Epic.  Pertinent Imaging: Results for orders placed or performed in visit on 07/16/16  BLADDER SCAN AMB NON-IMAGING  Result Value Ref Range   Scan Result 70ml      Assessment & Plan:  55 year old female with history of recent severe hemorrhagic cystitis secondary to Proteus UTI. She said adequately treated with antibiotics and has had a negative follow-up culture. She can does have persistent urinary symptoms including frequency, urgency, urge incontinence.    I suspect her symptoms are related to ongoing/ resolving bladder formation secondary to recent infection. We'll repeat urine culture. Treat symptomatically.  1. Gross hematuria Secondary to infectious hemorrhagic cystitis, resolved with antibiotics. - Urinalysis, Complete  2. Urinary frequency Will prescribe  Ditropan 5 mg 3 times a day when necessary as well as Pyridium for symptomatically relief Advised to return if symptoms do not improve in 2-3 weeks - BLADDER SCAN AMB NON-IMAGING - CULTURE, URINE COMPREHENSIVE  3. Recurrent UTI Lengthy discussion today about techniques for UTI prevention including hygiene issues Recommend addition of probiotic daily Cranberry tablets twice a day Brief discussion today about addition of topical estrogen cream if her frequency of infections increases  Instructions/ visit summery was written out today for patient    Return if symptoms worsen or fail to improve.  Hollice Espy, MD  [facility] [address], [place], [place] [address] [phone]

## 2016-07-18 LAB — CULTURE, URINE COMPREHENSIVE

## 2016-07-28 ENCOUNTER — Ambulatory Visit: Payer: Managed Care, Other (non HMO) | Admitting: Urology

## 2016-09-06 ENCOUNTER — Ambulatory Visit (INDEPENDENT_AMBULATORY_CARE_PROVIDER_SITE_OTHER): Payer: Managed Care, Other (non HMO) | Admitting: Family Medicine

## 2016-09-06 ENCOUNTER — Encounter: Payer: Self-pay | Admitting: Family Medicine

## 2016-09-06 VITALS — BP 108/60 | HR 90 | Temp 99.0°F | Resp 18 | Wt 130.0 lb

## 2016-09-06 DIAGNOSIS — J111 Influenza due to unidentified influenza virus with other respiratory manifestations: Secondary | ICD-10-CM

## 2016-09-06 MED ORDER — HYDROCODONE-HOMATROPINE 5-1.5 MG/5ML PO SYRP: ORAL_SOLUTION | ORAL | 0 refills | Status: DC

## 2016-09-06 MED ORDER — OSELTAMIVIR PHOSPHATE 75 MG PO CAPS: 75.0000 mg | ORAL_CAPSULE | Freq: Two times a day (BID) | ORAL | 0 refills | Status: DC

## 2016-09-06 NOTE — Patient Instructions (Signed)
Discussed use of Mucinex D for congestion and Delsym for cough. 

## 2016-09-06 NOTE — Progress Notes (Signed)
Subjective:     Patient ID: Cheryl Cooke, female   DOB: 05-Aug-1961, 56 y.o.   MRN: [id number]  HPI  Chief Complaint  Patient presents with  . URI    x 1 day  Reports flu sx with temp to 102. + flu shot.   Review of Systems     Objective:   Physical Exam  Constitutional: She appears well-developed and well-nourished. She does not have a sickly appearance. No distress.  Ears: T.M's intact without inflammation Throat: no tonsillar enlargement or exudate Neck: no cervical adenopathy Lungs: clear     Assessment:    1. Flu - oseltamivir (TAMIFLU) 75 MG capsule; Take 1 capsule (75 mg total) by mouth 2 (two) times daily.  Dispense: 10 capsule; Refill: 0 - HYDROcodone-homatropine (HYCODAN) 5-1.5 MG/5ML syrup; 5 ml 4-6 hours as needed for cough  Dispense: 240 mL; Refill: 0    Plan:   Discussed use of Mucinex D and Delsym.

## 2016-09-22 ENCOUNTER — Ambulatory Visit (INDEPENDENT_AMBULATORY_CARE_PROVIDER_SITE_OTHER): Payer: Managed Care, Other (non HMO) | Admitting: Family Medicine

## 2016-09-22 ENCOUNTER — Encounter: Payer: Self-pay | Admitting: Family Medicine

## 2016-09-22 VITALS — BP 112/60 | HR 72 | Temp 98.3°F | Resp 16 | Wt 131.0 lb

## 2016-09-22 DIAGNOSIS — J329 Chronic sinusitis, unspecified: Secondary | ICD-10-CM

## 2016-09-22 MED ORDER — AMOXICILLIN 500 MG PO CAPS: 1000.0000 mg | ORAL_CAPSULE | Freq: Two times a day (BID) | ORAL | 0 refills | Status: DC

## 2016-09-22 NOTE — Progress Notes (Signed)
[name] Female    DOB: 1961-07-15   56 y.o.   MRN: [id number] Visit Date: 09/22/2016  Today's Provider: Lelon Huh, MD   Chief Complaint  Patient presents with  . Sore Throat   Subjective:    Sore Throat   This is a new problem. The current episode started in the past 7 days. The problem has been unchanged. Neither side of throat is experiencing more pain than the other. Maximum temperature: has felt feverish. Associated symptoms include congestion, coughing, ear pain, a hoarse voice, a plugged ear sensation, swollen glands and trouble swallowing. She has had no exposure to strep or mono. Exposure to: patient was diagnosed with the flu about 10 days ago. . She has tried gargles and acetaminophen for the symptoms. The treatment provided mild relief.   Was seen for flu symptoms on 1/27.2018 and treated with 5 days of Tamiflu and symptoms mostly resolved. Sore throat and hoarseness began about three days ago. Has had persistent sinus pressure and blowing out yellow and green discharge. Is using Net[employer] consistently.     Allergies  Allergen Reactions  . Biaxin [Clarithromycin]   . Clarithromycin Nausea Only  . Keflex [Cephalexin] Nausea Only    Dizziness and stomach pain  . Levofloxacin     Other reaction(s): Nausea  . Shrimp [Shellfish Allergy] Swelling  . Sulfa Antibiotics Swelling     Current Outpatient Prescriptions:  .  calcium-vitamin D (OSCAL WITH D) 250-125 MG-UNIT per tablet, Take 1 tablet by mouth daily. , Disp: , Rfl:  .  etanercept (ENBREL) 50 MG/ML injection, Inject 50 mg into the skin. , Disp: , Rfl:  .  leflunomide (ARAVA) 20 MG tablet, Take 20 mg by mouth daily. , Disp: , Rfl:  .  HYDROcodone-homatropine (HYCODAN) 5-1.5 MG/5ML syrup, 5 ml 4-6 hours as needed for cough (Patient not taking: Reported on 09/22/2016), Disp: 240 mL, Rfl: 0 .  oseltamivir (TAMIFLU) 75 MG capsule, Take 1 capsule (75 mg total) by mouth 2 (two) times daily. (Patient  not taking: Reported on 09/22/2016), Disp: 10 capsule, Rfl: 0  Review of Systems  HENT: Positive for congestion, ear pain, hoarse voice and trouble swallowing.   Respiratory: Positive for cough.     Social History  Substance Use Topics  . Smoking status: Former Smoker    Years: 3.00    Quit date: 08/11/1983  . Smokeless tobacco: Never Used     Comment: was a social smoker  . Alcohol use 0.0 oz/week     Comment: occasionally   Objective:   BP 112/60 (BP Location: Right Arm, Patient Position: Sitting, Cuff Size: Normal)   Pulse 72   Temp 98.3 F (36.8 C)   Resp 16   Wt 131 lb (59.4 kg)   BMI 23.21 kg/m   Physical Exam  General Appearance:    Alert, cooperative, no distress  HENT:   bilateral TM normal without fluid or infection, neck without nodes, throat normal without erythema or exudate, pharynx erythematous without exudate, post nasal drip noted and nasal mucosa congested  Eyes:    PERRL, conjunctiva/corneas clear, EOM's intact       Lungs:     Clear to auscultation bilaterally, respirations unlabored  Heart:    Regular rate and rhythm  Neurologic:   Awake, alert, oriented x 3. No apparent focal neurological           defect.  Assessment & Plan:     1. Sinusitis, unspecified chronicity, unspecified location  - amoxicillin (AMOXIL) 500 MG capsule; Take 2 capsules (1,000 mg total) by mouth 2 (two) times daily.  Dispense: 40 capsule; Refill: 0       Lelon Huh, MD  [facility]

## 2016-10-02 ENCOUNTER — Telehealth: Payer: Self-pay | Admitting: *Deleted

## 2016-10-02 NOTE — Telephone Encounter (Signed)
Patient called office concerning sinus infection. Patient was seen in office 09/22/16. Patient was given rx for amoxicillin. Patient stated that she feels her symptoms have not improved much. Patient wanted to know want she should do? Please advise?

## 2016-10-03 MED ORDER — AMOXICILLIN-POT CLAVULANATE 875-125 MG PO TABS: 1.0000 | ORAL_TABLET | Freq: Two times a day (BID) | ORAL | 0 refills | Status: AC

## 2016-10-03 NOTE — Telephone Encounter (Signed)
Have sent new prescription for Augmentin his pharmacy.

## 2016-10-03 NOTE — Telephone Encounter (Signed)
Advised patient as below.  

## 2016-10-23 ENCOUNTER — Ambulatory Visit (INDEPENDENT_AMBULATORY_CARE_PROVIDER_SITE_OTHER): Payer: Self-pay | Admitting: Family Medicine

## 2016-10-23 DIAGNOSIS — Z23 Encounter for immunization: Secondary | ICD-10-CM

## 2016-10-23 NOTE — Progress Notes (Signed)
States she has started a new [employer], Cheryl Cooke, and her rheumatologist recommended she get the Zoster vaccine. Discussed that Shingrix may be a better choice for her when it comes out as not a live vaccine. Also at 51 her insurance may not cover the live vaccine. She will discuss further with her rheumatologist and see if her pharmacy plans to get the vaccine.

## 2017-06-05 ENCOUNTER — Encounter: Payer: Self-pay | Admitting: Family Medicine

## 2017-06-05 ENCOUNTER — Ambulatory Visit (INDEPENDENT_AMBULATORY_CARE_PROVIDER_SITE_OTHER): Payer: BLUE CROSS/BLUE SHIELD | Admitting: Family Medicine

## 2017-06-05 VITALS — BP 92/58 | HR 58 | Temp 98.8°F | Resp 16 | Ht 62.75 in | Wt 132.0 lb

## 2017-06-05 DIAGNOSIS — Z0000 Encounter for general adult medical examination without abnormal findings: Secondary | ICD-10-CM

## 2017-06-05 DIAGNOSIS — Z23 Encounter for immunization: Secondary | ICD-10-CM | POA: Diagnosis not present

## 2017-06-05 DIAGNOSIS — E7800 Pure hypercholesterolemia, unspecified: Secondary | ICD-10-CM | POA: Diagnosis not present

## 2017-06-05 LAB — COMPLETE METABOLIC PANEL WITH GFR
AG Ratio: 1.5 (calc) (ref 1.0–2.5)
ALBUMIN MSPROF: 4 g/dL (ref 3.6–5.1)
ALT: 14 U/L (ref 6–29)
AST: 19 U/L (ref 10–35)
Alkaline phosphatase (APISO): 52 U/L (ref 33–130)
BUN: 15 mg/dL (ref 7–25)
CALCIUM: 9.7 mg/dL (ref 8.6–10.4)
CO2: 27 mmol/L (ref 20–32)
CREATININE: 0.77 mg/dL (ref 0.50–1.05)
Chloride: 107 mmol/L (ref 98–110)
GFR, EST AFRICAN AMERICAN: 99 mL/min/{1.73_m2} (ref >=60)
GFR, EST NON AFRICAN AMERICAN: 86 mL/min/{1.73_m2} (ref >=60)
GLOBULIN: 2.6 g/dL (ref 1.9–3.7)
Glucose, Bld: 86 mg/dL (ref 65–99)
Potassium: 4.6 mmol/L (ref 3.5–5.3)
SODIUM: 140 mmol/L (ref 135–146)
TOTAL PROTEIN: 6.6 g/dL (ref 6.1–8.1)
Total Bilirubin: 0.6 mg/dL (ref 0.2–1.2)

## 2017-06-05 LAB — LIPID PANEL
CHOLESTEROL: 240 mg/dL — AB (ref <200)
HDL: 88 mg/dL (ref >50)
LDL CHOLESTEROL (CALC): 129 mg/dL — AB
Non-HDL Cholesterol (Calc): 152 mg/dL (calc) — ABNORMAL HIGH (ref <130)
Total CHOL/HDL Ratio: 2.7 (calc) (ref <5.0)
Triglycerides: 118 mg/dL (ref <150)

## 2017-06-05 NOTE — Patient Instructions (Signed)
   The CDC recommends two doses of Shingrix (the shingles vaccine) separated by 2 to 6 months for adults age 57 years and older. I recommend checking with your insurance plan regarding coverage for this vaccine.   

## 2017-06-05 NOTE — Progress Notes (Signed)
[name], Female    DOB: Jan 28, 1961, 56 y.o.   MRN: [id number] Visit Date: 06/05/2017  Today's Provider: Lelon Huh, MD   Chief Complaint  Patient presents with  . Annual Exam  . Rheumatoid Arthritis    follow up  . Hyperlipidemia    follow up   Subjective:    Annual physical exam Cheryl Cooke is a 56 y.o. female who presents today for health maintenance and complete physical. She feels fairly well. She reports exercising 2-3 times a week. She reports she is sleeping fairly well.  -----------------------------------------------------------------   Lipid/Cholesterol, Follow-up:   Last seen for this 1 years ago.  Management since that visit includes; labs checked. Advised to limit saturated fats in diet to no more than 10 grams qd. Recommended taking 81 mg aspirin.  Last Lipid Panel:    Component Value Date/Time   CHOL 262 (H) 05/20/2016 0846   TRIG 103 05/20/2016 0846   HDL 76 05/20/2016 0846   CHOLHDL 3.4 05/20/2016 0846   LDLCALC 165 (H) 05/20/2016 0846    She reports good compliance with treatment. She is not having side effects.   Wt Readings from Last 3 Encounters:  09/22/16 131 lb (59.4 kg)  09/06/16 130 lb (59 kg)  07/16/16 133 lb (60.3 kg)    ------------------------------------------------------------------------  Rheumatoid arthritis, involving unspecified site, unspecified rheumatoid factor presence ([place]) From 05/19/2016-Continue routine follow up with rheumatology.Patient reports good compliance with treatment.  Review of Systems  Constitutional: Positive for diaphoresis. Negative for chills, fatigue and fever.  HENT: Positive for congestion, dental problem, postnasal drip and sinus pressure. Negative for ear pain, rhinorrhea, sneezing and sore throat.   Eyes: Negative.  Negative for pain and redness.  Respiratory: Negative for cough, shortness of breath and wheezing.   Cardiovascular: Negative for chest pain and  leg swelling.  Gastrointestinal: Positive for abdominal pain. Negative for blood in stool, constipation, diarrhea and nausea.  Endocrine: Negative for polydipsia and polyphagia.  Genitourinary: Positive for enuresis and frequency. Negative for dysuria, flank pain, hematuria, pelvic pain, vaginal bleeding and vaginal discharge.  Musculoskeletal: Negative for arthralgias, back pain, gait problem and joint swelling.  Skin: Negative for rash.  Allergic/Immunologic: Positive for immunocompromised state.  Neurological: Negative.  Negative for dizziness, tremors, seizures, weakness, light-headedness, numbness and headaches.  Hematological: Negative for adenopathy.  Psychiatric/Behavioral: Negative.  Negative for behavioral problems, confusion and dysphoric mood. The patient is not nervous/anxious and is not hyperactive.     Social History      She  reports that she quit smoking about 33 years ago. She quit after 3.00 years of use. She has never used smokeless tobacco. She reports that she drinks alcohol. She reports that she does not use drugs.       Social History   Social History  . Marital status: Married    Spouse name: N/A  . Number of children: 1  . Years of education: H/S   Occupational History  . Massage Therapist    Social History Main Topics  . Smoking status: Former Smoker    Years: 3.00    Quit date: 08/11/1983  . Smokeless tobacco: Never Used     Comment: was a social smoker  . Alcohol use 0.0 oz/week     Comment: occasionally  . Drug use: No  . Sexual activity: Yes    Birth control/ protection: Post-menopausal   Other Topics Concern  . None   Social History Narrative  .  None    Past Medical History:  Diagnosis Date  . Absolute anemia 02/26/2015   During pregnancy.   . Anxiety   . Depression   . Dysplasia of cervix, low grade (CIN 1) 2005   OBSERVATION  . H/O: vasectomy    HUSBAND HAS HAD VASECTOMY  . Herpes progenitalis      Patient Active Problem List    Diagnosis Date Noted  . Allergic rhinitis 02/26/2015  . Clinical depression 02/26/2015  . Accumulation of fluid in tissues 02/26/2015  . Acid reflux 02/26/2015  . Hypercholesteremia 02/26/2015  . IBS (irritable bowel syndrome) 02/26/2015  . Late luteal phase dysphoric disorder (LLPDD) 02/26/2015  . Arthritis or polyarthritis, rheumatoid ([place]) 02/26/2015  . 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 Herpes progenitalis     Past Surgical History:  Procedure Laterality Date  . BREAST SURGERY Bilateral 1979   REDUCTION  . FOOT SURGERY  1979 AND 2002   RIGHT 1979, LEFT 2002; Bunion and hammertoes  . REDUCTION MAMMAPLASTY Bilateral 1978    Family History        Family Status  Relation Status  . Mother Deceased  . Father (Not Specified)  . MGM (Not Specified)  . MGF (Not Specified)  . Neg Hx (Not Specified)        Her family history includes Allergies in her mother; Alzheimer's disease in her mother; Cancer in her maternal grandmother; Diabetes in her mother; Gout in her father; Heart attack in her father and mother; Heart disease in her father; Hypertension in her mother; Stroke in her maternal grandfather and mother.     Allergies  Allergen Reactions  . Clarithromycin Nausea Only  . Keflex [Cephalexin] Nausea Only    Dizziness and stomach pain  . Levofloxacin     Other reaction(s): Nausea  . Shrimp [Shellfish Allergy] Swelling  . Sulfa Antibiotics Swelling     Current Outpatient Prescriptions:  .  calcium-vitamin D (OSCAL WITH D) 250-125 MG-UNIT per tablet, Take 1 tablet by mouth daily. , Disp: , Rfl:  .  leflunomide (ARAVA) 20 MG tablet, Take 20 mg by mouth daily. , Disp: , Rfl:  .  Tofacitinib Citrate (XELJANZ XR) 11 MG TB24, Take 1 capsule by mouth daily., Disp: , Rfl:    Patient Care Team: Birdie Sons, MD as PCP - General (Family Medicine) Butch Penny, MD as Referring Physician (Rheumatology)      Objective:   Vitals: BP (!) 92/58 (BP  Location: Left Arm, Patient Position: Sitting, Cuff Size: Normal)   Pulse (!) 58   Temp 98.8 F (37.1 C) (Oral)   Resp 16   Ht 5' 2.75" (1.594 m)   Wt 132 lb (59.9 kg)   SpO2 98%   BMI 23.57 kg/m   There were no vitals filed for this visit.   Physical Exam   General Appearance:    Alert, cooperative, no distress, appears stated age  Head:    Normocephalic, without obvious abnormality, atraumatic  Eyes:    PERRL, conjunctiva/corneas clear, EOM's intact, fundi    benign, both eyes  Ears:    Normal TM's and external ear canals, both ears  Nose:   Nares normal, septum midline, mucosa normal, no drainage    or sinus tenderness  Throat:   Lips, mucosa, and tongue normal; teeth and gums normal  Neck:   Supple, symmetrical, trachea midline, no adenopathy;    thyroid:  no enlargement/tenderness/nodules; no carotid   bruit or JVD  Back:  Symmetric, no curvature, ROM normal, no CVA tenderness  Lungs:     Clear to auscultation bilaterally, respirations unlabored  Chest Wall:    No tenderness or deformity   Heart:    Regular rate and rhythm, S1 and S2 normal, no murmur, rub   or gallop  Breast Exam:    normal appearance, no masses or tenderness  Abdomen:     Soft, non-tender, bowel sounds active all four quadrants,    no masses, no organomegaly  Pelvic:    deferred  Extremities:   Extremities normal, atraumatic, no cyanosis or edema  Pulses:   2+ and symmetric all extremities  Skin:   Skin color, texture, turgor normal, no rashes or lesions  Lymph nodes:   Cervical, supraclavicular, and axillary nodes normal  Neurologic:   CNII-XII intact, normal strength, sensation and reflexes    throughout    Depression Screen PHQ 2/9 Scores 06/05/2017 05/19/2016 05/09/2015  PHQ - 2 Score 0 0 0  PHQ- 9 Score 2 0 -      Assessment & Plan:     Routine Health Maintenance and Physical Exam  Exercise Activities and Dietary recommendations Goals    . Exercise 150 minutes per week (moderate  activity)       Immunization History  Administered Date(s) Administered  . Influenza Split 07/22/2006  . Influenza,inj,Quad PF,6+ Mos 05/09/2015, 05/19/2016  . Tdap 04/18/2014    Health Maintenance  Topic Date Due  . HIV Screening  04/01/1975  . COLONOSCOPY  03/31/2010  . INFLUENZA VACCINE  03/11/2017  . MAMMOGRAM  05/13/2018  . PAP SMEAR  05/19/2021  . TETANUS/TDAP  04/18/2024  . Hepatitis C Screening  Completed     Discussed health benefits of physical activity, and encouraged her to engage in regular exercise appropriate for her age and condition.    --------------------------------------------------------------------  1. Annual physical exam Generally doing well. She is scheduled for mammogram. She was unable to complete colonoscopy in the past due to tortuous colon therefore she was given iFOBT collection kit today.   2. Hypercholesteremia Diet controlled - Lipid panel - Comprehensive Metabolic Panel (CMET)  3. Need for influenza vaccination  - Flu Vaccine QUAD 6+ mos PF IM (Fluarix Quad PF)   Lelon Huh, MD  [facility]

## 2017-06-08 LAB — HM MAMMOGRAPHY

## 2017-06-09 ENCOUNTER — Encounter: Payer: 59 | Admitting: Family Medicine

## 2017-06-09 ENCOUNTER — Telehealth: Payer: Self-pay

## 2017-06-09 NOTE — Telephone Encounter (Signed)
Pt advised and agrees with treatment plan. 

## 2017-06-09 NOTE — Telephone Encounter (Signed)
-----   Message from Birdie Sons, MD sent at 06/08/2017  1:02 PM EDT ----- cholesterol a little high at 240. But much is this is due to HDL (good cholesterol) avoid saturated fats in diet, no medics needed. Check every 1-2 years. Rest of labs are normal.

## 2017-06-10 ENCOUNTER — Other Ambulatory Visit (INDEPENDENT_AMBULATORY_CARE_PROVIDER_SITE_OTHER): Payer: BLUE CROSS/BLUE SHIELD

## 2017-06-10 DIAGNOSIS — Z1211 Encounter for screening for malignant neoplasm of colon: Secondary | ICD-10-CM

## 2017-06-10 LAB — IFOBT (OCCULT BLOOD): IFOBT: NEGATIVE

## 2017-06-16 ENCOUNTER — Encounter: Payer: Self-pay | Admitting: *Deleted

## 2017-06-17 ENCOUNTER — Telehealth: Payer: Self-pay | Admitting: Family Medicine

## 2017-06-17 NOTE — Telephone Encounter (Signed)
Pt is returning call.  CB#[phone]/MW

## 2017-06-17 NOTE — Telephone Encounter (Signed)
Tried calling patient back, number was busy X 2.

## 2017-06-18 NOTE — Telephone Encounter (Signed)
LMOVM for pt to return call 

## 2017-06-18 NOTE — Telephone Encounter (Signed)
Advised patient of results.  

## 2017-10-19 ENCOUNTER — Ambulatory Visit (INDEPENDENT_AMBULATORY_CARE_PROVIDER_SITE_OTHER): Payer: BLUE CROSS/BLUE SHIELD | Admitting: Family Medicine

## 2017-10-19 ENCOUNTER — Encounter: Payer: Self-pay | Admitting: Family Medicine

## 2017-10-19 VITALS — BP 120/78 | HR 72 | Temp 97.6°F | Resp 16

## 2017-10-19 DIAGNOSIS — N390 Urinary tract infection, site not specified: Secondary | ICD-10-CM

## 2017-10-19 DIAGNOSIS — R319 Hematuria, unspecified: Secondary | ICD-10-CM | POA: Diagnosis not present

## 2017-10-19 DIAGNOSIS — R300 Dysuria: Secondary | ICD-10-CM | POA: Diagnosis not present

## 2017-10-19 LAB — POCT URINALYSIS DIPSTICK
Bilirubin, UA: NEGATIVE
Glucose, UA: NEGATIVE
Ketones, UA: NEGATIVE
Nitrite, UA: NEGATIVE
PH UA: 7 (ref 5.0–8.0)
Spec Grav, UA: 1.01 (ref 1.010–1.025)
UROBILINOGEN UA: 0.2 U/dL

## 2017-10-19 MED ORDER — AMOXICILLIN 500 MG PO CAPS: 1000.0000 mg | ORAL_CAPSULE | Freq: Two times a day (BID) | ORAL | 0 refills | Status: AC

## 2017-10-19 NOTE — Progress Notes (Signed)
[name] Female    DOB: 21-May-1961   57 y.o.   MRN: [id number] Visit Date: 10/19/2017  Today's Provider: Lelon Huh, MD   Chief Complaint  Patient presents with  . Dysuria   Subjective:    Dysuria   This is a new problem. The current episode started yesterday. The problem has been gradually worsening. The quality of the pain is described as burning. There has been no fever. Pertinent negatives include no chills, nausea or vomiting. Treatments tried: Essential oils. The treatment provided mild relief.  Patient reports the appearance of her urine is cloudy and has a bad odor.     Allergies  Allergen Reactions  . Clarithromycin Nausea Only  . Keflex [Cephalexin] Nausea Only    Dizziness and stomach pain  . Levofloxacin     Other reaction(s): Nausea  . Shrimp [Shellfish Allergy] Swelling  . Sulfa Antibiotics Swelling     Current Outpatient Medications:  .  calcium-vitamin D (OSCAL WITH D) 250-125 MG-UNIT per tablet, Take 1 tablet by mouth daily. , Disp: , Rfl:  .  leflunomide (ARAVA) 20 MG tablet, Take 20 mg by mouth daily. , Disp: , Rfl:  .  Tofacitinib Citrate (XELJANZ XR) 11 MG TB24, Take 1 capsule by mouth daily., Disp: , Rfl:   Review of Systems  Constitutional: Negative for appetite change, chills, fatigue and fever.  Respiratory: Negative for chest tightness and shortness of breath.   Cardiovascular: Negative for chest pain and palpitations.  Gastrointestinal: Negative for abdominal pain, nausea and vomiting.  Genitourinary: Positive for dysuria.  Neurological: Negative for dizziness and weakness.    Social History   Tobacco Use  . Smoking status: Former Smoker    Years: 3.00    Last attempt to quit: 08/11/1983    Years since quitting: 34.2  . Smokeless tobacco: Never Used  . Tobacco comment: was a social smoker  Substance Use Topics  . Alcohol use: Yes    Alcohol/week: 0.0 oz    Comment: occasionally   Objective:   BP 120/78  (BP Location: Left Arm, Patient Position: Sitting, Cuff Size: Normal)   Pulse 72   Temp 97.6 F (36.4 C) (Oral)   Resp 16   SpO2 98% Comment: room air Vitals:   10/19/17 1628  BP: 120/78  Pulse: 72  Resp: 16  Temp: 97.6 F (36.4 C)  TempSrc: Oral  SpO2: 98%     Physical Exam  General appearance: alert, well developed, well nourished, cooperative and in no distress Head: Normocephalic, without obvious abnormality, atraumatic Respiratory: Respirations even and unlabored, normal respiratory rate   Results for orders placed or performed in visit on 10/19/17  POCT Urinalysis Dipstick  Result Value Ref Range   Color, UA yellow    Clarity, UA cloudy    Glucose, UA negative    Bilirubin, UA negative    Ketones, UA negative    Spec Grav, UA 1.010 1.010 - 1.025   Blood, UA Moderate (hemolyzed)    pH, UA 7.0 5.0 - 8.0   Protein, UA Trace    Urobilinogen, UA 0.2 0.2 or 1.0 E.U./dL   Nitrite, UA negative    Leukocytes, UA Moderate (2+) (A) Negative   Appearance     Odor         Assessment & Plan:     1. Dysuria  - POCT Urinalysis Dipstick  2. Urinary tract infection with hematuria, site unspecified  - Urine Culture - amoxicillin (  AMOXIL) 500 MG capsule; Take 2 capsules (1,000 mg total) by mouth 2 (two) times daily for 7 days.  Dispense: 28 capsule; Refill: 0       Lelon Huh, MD  [facility]

## 2017-10-21 LAB — URINE CULTURE

## 2018-06-10 ENCOUNTER — Encounter: Payer: Self-pay | Admitting: Family Medicine

## 2018-06-10 ENCOUNTER — Ambulatory Visit (INDEPENDENT_AMBULATORY_CARE_PROVIDER_SITE_OTHER): Payer: BLUE CROSS/BLUE SHIELD | Admitting: Family Medicine

## 2018-06-10 VITALS — BP 124/74 | HR 69 | Temp 98.0°F | Resp 16 | Wt 133.0 lb

## 2018-06-10 DIAGNOSIS — R1084 Generalized abdominal pain: Secondary | ICD-10-CM

## 2018-06-10 DIAGNOSIS — M069 Rheumatoid arthritis, unspecified: Secondary | ICD-10-CM

## 2018-06-10 DIAGNOSIS — E7800 Pure hypercholesterolemia, unspecified: Secondary | ICD-10-CM

## 2018-06-10 DIAGNOSIS — K589 Irritable bowel syndrome without diarrhea: Secondary | ICD-10-CM | POA: Diagnosis not present

## 2018-06-10 DIAGNOSIS — Z23 Encounter for immunization: Secondary | ICD-10-CM

## 2018-06-10 DIAGNOSIS — D84821 Immunodeficiency due to drugs: Secondary | ICD-10-CM | POA: Insufficient documentation

## 2018-06-10 DIAGNOSIS — Z0000 Encounter for general adult medical examination without abnormal findings: Secondary | ICD-10-CM | POA: Diagnosis not present

## 2018-06-10 DIAGNOSIS — Z79899 Other long term (current) drug therapy: Secondary | ICD-10-CM | POA: Diagnosis not present

## 2018-06-10 NOTE — Progress Notes (Signed)
[name], Female    DOB: 08-15-1960, 57 y.o.   MRN: [id number] Visit Date: 06/10/2018  Today's Provider: Lelon Huh, MD   Chief Complaint  Patient presents with  . Annual Exam  . Hyperlipidemia   Subjective:    Annual physical exam Cheryl Cooke is a 57 y.o. female who presents today for health maintenance and complete physical. She feels well. She reports exercising 2-3 times per week. She reports she is sleeping fairly well.  She is followed by Dr. Harrington Challenger for RA and reports she is doing well with current medications. Reports she had liver functions and CBC checked recently which were normal.   -----------------------------------------------------------------  Lipid/Cholesterol, Follow-up:   Last seen for this 1 years ago.  Management changes since that visit include advised to avoid saturated fats in diet and labs were checked. Patient was recommend to take Aspirin 81mg .  Last Lipid Panel:    Component Value Date/Time   CHOL 240 (H) 06/05/2017 1000   CHOL 262 (H) 05/20/2016 0846   TRIG 118 06/05/2017 1000   HDL 88 06/05/2017 1000   HDL 76 05/20/2016 0846   CHOLHDL 2.7 06/05/2017 1000   LDLCALC 129 (H) 06/05/2017 1000      She reports good compliance with treatment. She is not having side effects.  Weight trend: stable Current diet: in general, a "healthy" diet     Wt Readings from Last 3 Encounters:  06/10/18 133 lb (60.3 kg)  06/05/17 132 lb (59.9 kg)  09/22/16 131 lb (59.4 kg)    -------------------------------------------------------------------  She also reports she has several months of stomach bloating, pains, and episodes of bowel changes alternating loose stools and constipation. She requests referral to GI for evaluation. She did have attempted colonoscopy by Dr. Ara Kussmaul in the 2000s, but was not able to be completed, apparently due to tortuosity of colon. She did have BE and has been doing annual iFOBT since then.    Review of Systems  Constitutional: Negative.   HENT: Positive for congestion, postnasal drip and sinus pressure.   Eyes: Negative.   Respiratory: Negative.   Cardiovascular: Negative.   Gastrointestinal: Positive for abdominal distention and abdominal pain.  Endocrine: Negative.   Genitourinary: Positive for urgency.  Musculoskeletal: Negative.   Skin: Positive for rash.  Allergic/Immunologic: Positive for environmental allergies and immunocompromised state.  Neurological: Positive for numbness.  Hematological: Negative.   Psychiatric/Behavioral: Negative.     Social History She  reports that she quit smoking about 34 years ago. She quit after 3.00 years of use. She has never used smokeless tobacco. She reports that she drinks alcohol. She reports that she does not use drugs. Social History   Socioeconomic History  . Marital status: Married    Spouse name: Not on file  . Number of children: 1  . Years of education: H/S  . Highest education level: Not on file  Occupational History  . Occupation: Geophysicist/field seismologist  Social Needs  . Financial resource strain: Not on file  . Food insecurity:    Worry: Not on file    Inability: Not on file  . Transportation needs:    Medical: Not on file    Non-medical: Not on file  Tobacco Use  . Smoking status: Former Smoker    Years: 3.00    Last attempt to quit: 08/11/1983    Years since quitting: 34.8  . Smokeless tobacco: Never Used  . Tobacco comment: was a social smoker  Substance and Sexual Activity  . Alcohol use: Yes    Alcohol/week: 0.0 standard drinks    Comment: occasionally  . Drug use: No  . Sexual activity: Yes    Birth control/protection: Post-menopausal  Lifestyle  . Physical activity:    Days per week: Not on file    Minutes per session: Not on file  . Stress: Not on file  Relationships  . Social connections:    Talks on phone: Not on file    Gets together: Not on file    Attends religious service: Not on  file    Active member of club or organization: Not on file    Attends meetings of clubs or organizations: Not on file    Relationship status: Not on file  Other Topics Concern  . Not on file  Social History Narrative  . Not on file    Patient Active Problem List   Diagnosis Date Noted  . Allergic rhinitis 02/26/2015  . Clinical depression 02/26/2015  . Accumulation of fluid in tissues 02/26/2015  . Acid reflux 02/26/2015  . Hypercholesteremia 02/26/2015  . IBS (irritable bowel syndrome) 02/26/2015  . Late luteal phase dysphoric disorder (LLPDD) 02/26/2015  . Arthritis or polyarthritis, rheumatoid ([place]) 02/26/2015  . Gougerout-Sjoegren syndrome 02/26/2015  . Herpes progenitalis     Past Surgical History:  Procedure Laterality Date  . BREAST SURGERY Bilateral 1979   REDUCTION  . FOOT SURGERY  1979 AND 2002   RIGHT 1979, LEFT 2002; Bunion and hammertoes  . REDUCTION MAMMAPLASTY Bilateral 1978    Family History  Family Status  Relation Name Status  . Mother  Deceased  . Father  (Not Specified)  . MGM  (Not Specified)  . MGF  (Not Specified)  . Neg Hx  (Not Specified)   Her family history includes Allergies in her mother; Alzheimer's disease in her mother; Cancer in her maternal grandmother; Diabetes in her mother; Gout in her father; Heart attack in her father and mother; Heart disease in her father; Hypertension in her mother; Stroke in her maternal grandfather and mother.     Allergies  Allergen Reactions  . Clarithromycin Nausea Only  . Keflex [Cephalexin] Nausea Only    Dizziness and stomach pain  . Levofloxacin     Other reaction(s): Nausea  . Shrimp [Shellfish Allergy] Swelling  . Sulfa Antibiotics Swelling    Previous Medications   ASPIRIN EC 81 MG TABLET    Take 81 mg by mouth daily.   BARICITINIB (OLUMIANT) 2 MG TABS    Take 1 mg by mouth daily.   CALCIUM-VITAMIN D (OSCAL WITH D) 250-125 MG-UNIT PER TABLET    Take 1 tablet by mouth daily.    LEFLUNOMIDE  (ARAVA) 20 MG TABLET    Take 20 mg by mouth daily.    MELATONIN 2.5 MG CHEW    Chew 1 mg by mouth Nightly.    Patient Care Team: Birdie Sons, MD as PCP - General (Family Medicine) Butch Penny, MD as Referring Physician (Rheumatology)      Objective:   Vitals: BP 124/74 (BP Location: Right Arm, Patient Position: Sitting, Cuff Size: Normal)   Pulse 69   Temp 98 F (36.7 C) (Oral)   Resp 16   Wt 133 lb (60.3 kg)   SpO2 98%   BMI 23.75 kg/m    Physical Exam   General Appearance:    Alert, cooperative, no distress, appears stated age  Head:    Normocephalic, without obvious  abnormality, atraumatic  Eyes:    PERRL, conjunctiva/corneas clear, EOM's intact, fundi    benign, both eyes  Ears:    Normal TM's and external ear canals, both ears  Nose:   Nares normal, septum midline, mucosa normal, no drainage    or sinus tenderness  Throat:   Lips, mucosa, and tongue normal; teeth and gums normal  Neck:   Supple, symmetrical, trachea midline, no adenopathy;    thyroid:  no enlargement/tenderness/nodules; no carotid   bruit or JVD  Back:     Symmetric, no curvature, ROM normal, no CVA tenderness  Lungs:     Clear to auscultation bilaterally, respirations unlabored  Chest Wall:    No tenderness or deformity   Heart:    Regular rate and rhythm, S1 and S2 normal, no murmur, rub   or gallop  Breast Exam:    normal appearance, no masses or tenderness  Abdomen:     Soft, non-tender, bowel sounds active all four quadrants,    no masses, no organomegaly  Pelvic:    deferred  Extremities:   Extremities normal, atraumatic, no cyanosis or edema  Pulses:   2+ and symmetric all extremities  Skin:   Skin color, texture, turgor normal, no rashes or lesions  Lymph nodes:   Cervical, supraclavicular, and axillary nodes normal  Neurologic:   CNII-XII intact, normal strength, sensation and reflexes    throughout    Depression Screen PHQ 2/9 Scores 06/10/2018 06/05/2017 05/19/2016  05/09/2015  PHQ - 2 Score 0 0 0 0  PHQ- 9 Score - 2 0 -      Assessment & Plan:     Routine Health Maintenance and Physical Exam  Exercise Activities and Dietary recommendations Goals    . Exercise 150 minutes per week (moderate activity)       Immunization History  Administered Date(s) Administered  . Influenza Split 07/22/2006  . Influenza,inj,Quad PF,6+ Mos 05/09/2015, 05/19/2016, 06/05/2017  . Tdap 04/18/2014    Health Maintenance  Topic Date Due  . HIV Screening  04/01/1975  . COLONOSCOPY  03/31/2010  . INFLUENZA VACCINE  03/11/2018  . MAMMOGRAM  06/08/2018  . PAP SMEAR  05/19/2021  . TETANUS/TDAP  04/18/2024  . Hepatitis C Screening  Completed     Discussed health benefits of physical activity, and encouraged her to engage in regular exercise appropriate for her age and condition.    --------------------------------------------------------------------  1. Annual physical exam Doing well. Normal exam. M[facility] is [facility] at [facility] next month - Lipid panel  2. Immunocompromised state due to drug therapy  - Pneumococcal conjugate vaccine 13-valent IM  3. Irritable bowel syndrome, unspecified type  - Ambulatory referral to Gastroenterology  4. Hypercholesteremia Counseled regarding increase CV risk associated with RA and DMARDs. Is already on ECASA daily. Advised that taking statin would reduce CV risk significantly. She is amenable to starting medication.  - Lipid panel  5. Rheumatoid arthritis, involving unspecified site, unspecified rheumatoid factor presence ([place]) Doing well on current DMARDs. Continue regular follow up Dr. Harrington Challenger.  - Pneumococcal conjugate vaccine 13-valent IM  6. Need for influenza vaccination  - Flu Vaccine QUAD 6+ mos PF IM (Fluarix Quad PF)  7. Need for shingles vaccine  - Varicella-zoster vaccine IM  8. Generalized abdominal pain  - Ambulatory referral to Gastroenterology

## 2018-06-11 ENCOUNTER — Telehealth: Payer: Self-pay

## 2018-06-11 LAB — IFOBT (OCCULT BLOOD): IFOBT: NEGATIVE

## 2018-06-11 NOTE — Addendum Note (Signed)
Addended by: Ashley Royalty E on: 06/11/2018 08:25 AM   Modules accepted: Orders

## 2018-06-11 NOTE — Telephone Encounter (Signed)
-----   Message from Birdie Sons, MD sent at 06/11/2018 10:13 AM EDT ----- Dora Sims is negative. Check yearly.

## 2018-06-11 NOTE — Telephone Encounter (Signed)
Pt advised.   Thanks,   -Laura  

## 2018-06-12 LAB — LIPID PANEL
CHOL/HDL RATIO: 2.9 ratio (ref 0.0–4.4)
Cholesterol, Total: 284 mg/dL — ABNORMAL HIGH (ref 100–199)
HDL: 97 mg/dL (ref >39)
LDL Calculated: 171 mg/dL — ABNORMAL HIGH (ref 0–99)
Triglycerides: 78 mg/dL (ref 0–149)
VLDL Cholesterol Cal: 16 mg/dL (ref 5–40)

## 2018-06-14 ENCOUNTER — Telehealth: Payer: Self-pay

## 2018-06-14 MED ORDER — PRAVASTATIN SODIUM 40 MG PO TABS: 40.0000 mg | ORAL_TABLET | Freq: Every day | ORAL | 3 refills | Status: DC

## 2018-06-14 NOTE — Telephone Encounter (Signed)
Patient was advised and medication order send to the [facility] in [place].

## 2018-06-14 NOTE — Telephone Encounter (Signed)
-----   Message from Birdie Sons, MD sent at 06/13/2018  8:18 PM EST ----- LDL cholesterol is up to 171, needs to be under 130. Start pravastatin 40mg  once a day, #30, rf x 3. Call if any problems with prescription. Otherwise recheck in lipids in 3 months.  Stool test is negative. Check yearly.

## 2018-06-16 ENCOUNTER — Encounter: Payer: Self-pay | Admitting: Family Medicine

## 2018-06-16 ENCOUNTER — Ambulatory Visit: Payer: BLUE CROSS/BLUE SHIELD | Admitting: Family Medicine

## 2018-06-16 VITALS — BP 110/70 | HR 85 | Temp 98.8°F | Resp 16 | Wt 134.0 lb

## 2018-06-16 DIAGNOSIS — J069 Acute upper respiratory infection, unspecified: Secondary | ICD-10-CM | POA: Diagnosis not present

## 2018-06-16 DIAGNOSIS — R509 Fever, unspecified: Secondary | ICD-10-CM

## 2018-06-16 LAB — POCT INFLUENZA A/B
Influenza A, POC: NEGATIVE
Influenza B, POC: NEGATIVE

## 2018-06-16 NOTE — Progress Notes (Signed)
[name] Female    DOB: 20-Jul-1961   57 y.o.   MRN: [id number] Visit Date: 06/16/2018  Today's Provider: Lelon Huh, MD   Chief Complaint  Patient presents with  . URI   Subjective:    URI   This is a new problem. Episode onset: 4 days ago. The problem has been unchanged. Associated symptoms include coughing (this morning now resolved), headaches, nausea, rhinorrhea, sneezing and a sore throat. Pertinent negatives include no abdominal pain, chest pain, congestion or vomiting.  felt like she has having fevers, chills, and aches when sx started 4 days ago. States temperature over last 24 hours has maxed around 99.      Allergies  Allergen Reactions  . Clarithromycin Nausea Only  . Keflex [Cephalexin] Nausea Only    Dizziness and stomach pain  . Levofloxacin     Other reaction(s): Nausea  . Shrimp [Shellfish Allergy] Swelling  . Sulfa Antibiotics Swelling     Current Outpatient Medications:  .  aspirin EC 81 MG tablet, Take 81 mg by mouth daily., Disp: , Rfl:  .  Baricitinib (OLUMIANT) 2 MG TABS, Take 1 mg by mouth daily., Disp: , Rfl:  .  calcium-vitamin D (OSCAL WITH D) 250-125 MG-UNIT per tablet, Take 1 tablet by mouth daily. , Disp: , Rfl:  .  leflunomide (ARAVA) 20 MG tablet, Take 20 mg by mouth daily. , Disp: , Rfl:  .  Melatonin 2.5 MG CHEW, Chew 1 mg by mouth Nightly., Disp: , Rfl:  .  pravastatin (PRAVACHOL) 40 MG tablet, Take 1 tablet (40 mg total) by mouth daily., Disp: 30 tablet, Rfl: 3  Review of Systems  Constitutional: Positive for chills, fatigue and fever (low grade  of 99). Negative for appetite change.  HENT: Positive for postnasal drip, rhinorrhea, sinus pressure, sneezing and sore throat. Negative for congestion.   Respiratory: Positive for cough (this morning now resolved). Negative for chest tightness and shortness of breath.   Cardiovascular: Negative for chest pain and palpitations.  Gastrointestinal: Positive for nausea.  Negative for abdominal pain and vomiting.  Musculoskeletal: Positive for myalgias.  Neurological: Positive for dizziness, weakness and headaches.    Social History   Tobacco Use  . Smoking status: Former Smoker    Years: 3.00    Last attempt to quit: 08/11/1983    Years since quitting: 34.8  . Smokeless tobacco: Never Used  . Tobacco comment: was a social smoker  Substance Use Topics  . Alcohol use: Yes    Alcohol/week: 0.0 standard drinks    Comment: occasionally   Objective:   BP 110/70 (BP Location: Left Arm, Patient Position: Sitting, Cuff Size: Normal)   Pulse 85   Temp 98.8 F (37.1 C) (Oral)   Resp 16   Wt 134 lb (60.8 kg)   SpO2 96% Comment: room air  BMI 23.93 kg/m     Physical Exam  General Appearance:    Alert, cooperative, no distress  HENT:   bilateral TM normal without fluid or infection, neck without nodes, throat normal without erythema or exudate, sinuses nontender, post nasal drip noted and nasal mucosa congested  Eyes:    PERRL, conjunctiva/corneas clear, EOM's intact       Lungs:     Clear to auscultation bilaterally, respirations unlabored  Heart:    Regular rate and rhythm  Neurologic:   Awake, alert, oriented x 3. No apparent focal neurological  defect.       Flu A negative Flu B negative.     Assessment & Plan:     1. Upper respiratory tract infection, unspecified type Counseled regarding signs and symptoms of viral and bacterial respiratory infections. Advised to call or return for additional evaluation if she develops any sign of bacterial infection, or if current symptoms last longer than 10 days.    2. Fever, unspecified fever cause Resolved.  - POCT Influenza A/B       Lelon Huh, MD  [facility]

## 2018-07-02 LAB — HM MAMMOGRAPHY

## 2018-07-14 ENCOUNTER — Telehealth: Payer: Self-pay

## 2018-07-14 NOTE — Telephone Encounter (Signed)
Pt advised.   Thanks,   -Laura  

## 2018-07-14 NOTE — Telephone Encounter (Signed)
Pt returned missed call. Pt sees pt's as well.  Please call pt back.  Thanks, [employer]

## 2018-07-14 NOTE — Telephone Encounter (Signed)
Tried calling patient to advise her that her mammogram results from 07/02/2018 were normal. Left message to call back.

## 2018-07-21 ENCOUNTER — Telehealth: Payer: Self-pay | Admitting: Family Medicine

## 2018-07-21 ENCOUNTER — Telehealth: Payer: Self-pay

## 2018-07-21 NOTE — Telephone Encounter (Signed)
Patient called c/o having acute pain in abdomen and suddenly weak and all over heaviness and couldn't take a deep breathe.  Pt had a bout of diarrhea and she feels tired and has a little pressure headache.  Pt hasn't had anymore symptoms since 7 am today (07/21/18).  I asvised pt that she possibly could have had a virus and that she needed to call us back if the symptoms start up again.  She did note that her son or grandson was sick with a virus of the same symptoms.  I advised pt that she may have a virus and to let us kow if they start up aymore or if they worsen to go to ER.  dbs

## 2018-07-21 NOTE — Telephone Encounter (Signed)
This morning Abdominal pain - weak - heaviness - shortness of breath - diarrhea  10 min

## 2018-07-27 ENCOUNTER — Other Ambulatory Visit: Payer: Self-pay

## 2018-07-27 ENCOUNTER — Encounter

## 2018-07-27 ENCOUNTER — Encounter: Payer: Self-pay | Admitting: Gastroenterology

## 2018-07-27 ENCOUNTER — Ambulatory Visit: Payer: BLUE CROSS/BLUE SHIELD | Admitting: Gastroenterology

## 2018-07-27 VITALS — BP 112/67 | HR 76 | Ht 62.75 in | Wt 130.6 lb

## 2018-07-27 DIAGNOSIS — K580 Irritable bowel syndrome with diarrhea: Secondary | ICD-10-CM

## 2018-07-27 DIAGNOSIS — Z1211 Encounter for screening for malignant neoplasm of colon: Secondary | ICD-10-CM | POA: Diagnosis not present

## 2018-07-27 MED ORDER — AMITRIPTYLINE HCL 25 MG PO TABS: 25.0000 mg | ORAL_TABLET | Freq: Every day | ORAL | 1 refills | Status: DC

## 2018-07-27 NOTE — Progress Notes (Signed)
Cheryl Darby, MD [address]  [place]  [place], [place] [address]  Main: [phone]  Fax: [phone]    Gastroenterology Consultation  Referring Provider:     Birdie Sons, MD Primary Care Physician:  Birdie Sons, MD Primary Gastroenterologist:  Dr. Cephas Cooke Reason for Consultation:     Irritable bowel syndrome        HPI:   Cheryl Cooke is a 57 y.o. female referred by Dr. Birdie Sons, MD  for consultation & management of approximately 2 years history of abdominal pain associated with loose stools, abdominal bloating and mild abdominal cramps.  She reports that for the last 1 to 2 years, she has been going through a lot of stress in her family and has been taking care of her husband has been sick for few months and was hospitalized.  She developed PTSD due to being in the hospital with her husband.  She has been undergoing cognitive behavioral therapy.  Patient cannot became tearful that her symptoms were bothering her and she had to restrict diet.  She has been avoiding bread to help with her symptoms.  However, her weight has been stable.  She reports insomnia, takes melatonin at bedtime. She has chronic abdominal pain and underwent sigmoidoscopy back in 1990s and she was told that her colon was loopy and could not undergo full colonoscopy.  Patient has been undergoing annual FOBT for colon cancer screening and has been negative to date.  She denies any other GI symptoms Patient denies alcohol use or smoking tobacco  She has history of Sjogren's syndrome and rheumatoid arthritis, followed by rheumatologist in [place].  Currently on maintenance medication with baricitinib and leflunomide  NSAIDs: None  Antiplts/Anticoagulants/Anti thrombotics: None  GI Procedures: Sigmoidoscopy 1990s  Past Medical History:  Diagnosis Date  . Absolute anemia 02/26/2015   During pregnancy.   . Anxiety   . Depression   . Dysplasia of cervix, low grade (CIN  1) 2005   OBSERVATION  . H/O: vasectomy    HUSBAND HAS HAD VASECTOMY  . Herpes progenitalis     Past Surgical History:  Procedure Laterality Date  . BREAST SURGERY Bilateral 1979   REDUCTION  . FOOT SURGERY  1979 AND 2002   RIGHT 1979, LEFT 2002; Bunion and hammertoes  . REDUCTION MAMMAPLASTY Bilateral 1978    Current Outpatient Medications:  .  aspirin EC 81 MG tablet, Take 81 mg by mouth daily., Disp: , Rfl:  .  Baricitinib (OLUMIANT) 2 MG TABS, Take 1 mg by mouth daily., Disp: , Rfl:  .  calcium-vitamin D (OSCAL WITH D) 250-125 MG-UNIT per tablet, Take 1 tablet by mouth daily. , Disp: , Rfl:  .  leflunomide (ARAVA) 20 MG tablet, Take 20 mg by mouth daily. , Disp: , Rfl:  .  Melatonin 2.5 MG CHEW, Chew 1 mg by mouth Nightly., Disp: , Rfl:  .  pravastatin (PRAVACHOL) 40 MG tablet, Take 1 tablet (40 mg total) by mouth daily., Disp: 30 tablet, Rfl: 3 .  amitriptyline (ELAVIL) 25 MG tablet, Take 1 tablet (25 mg total) by mouth at bedtime., Disp: 30 tablet, Rfl: 1    Family History  Problem Relation Age of Onset  . Diabetes Mother   . Hypertension Mother   . Alzheimer's disease Mother   . Allergies Mother   . Stroke Mother   . Heart attack Mother   . Heart disease Father   . Gout Father   .  Heart attack Father   . Cancer Maternal Grandmother        UTERINE CANCER  . Stroke Maternal Grandfather   . Bladder Cancer Neg Hx   . Kidney cancer Neg Hx   . Prostate cancer Neg Hx      Social History   Tobacco Use  . Smoking status: Former Smoker    Years: 3.00    Last attempt to quit: 08/11/1983    Years since quitting: 34.9  . Smokeless tobacco: Never Used  . Tobacco comment: was a social smoker  Substance Use Topics  . Alcohol use: Yes    Alcohol/week: 0.0 standard drinks    Comment: occasionally  . Drug use: No    Allergies as of 07/27/2018 - Review Complete 07/27/2018  Allergen Reaction Noted  . Clarithromycin Nausea Only 02/26/2011  . Keflex [cephalexin]  Nausea Only 05/19/2016  . Levofloxacin  02/26/2015  . Shrimp [shellfish allergy] Swelling 02/26/2011  . Sulfa antibiotics Swelling 02/26/2011    Review of Systems:    All systems reviewed and negative except where noted in HPI.   Physical Exam:  BP 112/67   Pulse 76   Ht 5' 2.75" (1.594 m)   Wt 130 lb 9.6 oz (59.2 kg)   BMI 23.32 kg/m  No LMP recorded. Patient is postmenopausal.  General:   Alert,  Well-developed, well-nourished, pleasant and cooperative in NAD Head:  Normocephalic and atraumatic. Eyes:  Sclera clear, no icterus.   Conjunctiva pink. Ears:  Normal auditory acuity. Nose:  No deformity, discharge, or lesions. Mouth:  No deformity or lesions,oropharynx pink & moist. Neck:  Supple; no masses or thyromegaly. Lungs:  Respirations even and unlabored.  Clear throughout to auscultation.   No wheezes, crackles, or rhonchi. No acute distress. Heart:  Regular rate and rhythm; no murmurs, clicks, rubs, or gallops. Abdomen:  Normal bowel sounds. Soft, non-tender and non-distended without masses, hepatosplenomegaly or hernias noted.  No guarding or rebound tenderness.   Rectal: Not performed Msk:  Symmetrical, deformities in her bilateral hands from rheumatoid arthritis. Good, equal movement & strength bilaterally. Pulses:  Normal pulses noted. Extremities:  No clubbing or edema.  No cyanosis. Neurologic:  Alert and oriented x3;  grossly normal neurologically. Skin:  Intact without significant lesions or rashes. No jaundice. Psych:  Alert and cooperative. Normal mood and affect.  Imaging Studies: No abdominal imaging  Assessment and Plan:   Cheryl Cooke is a 57 y.o. Caucasian female with history of rheumatoid arthritis, Sjogren's, chronic history of abdominal pain, cramps associated with nonbloody, loose stools.  She does not have any constitutional symptoms.  She has been going through significant stress in last 22 years, history of PTSD and undergoing cognitive  behavioral therapy.  Her history is highly consistent with IBS diarrhea in setting of stress  IBS-diarrhea: -H. pylori breath test -Check celiac serologies -Discussed with her about trial of tricyclic antidepressant such as amitriptyline at low-dose.  Will start 25 mg at bedtime.  Reviewed anticholinergic side effects with her.  If she is not able to tolerate secondary to her underlying rheumatologic condition, will switch to an SSRI or an SNRI  Colon cancer screening Discussed with her about screening colonoscopy and patient is agreeable to undergo  Follow up in 1 month   Cheryl Darby, MD

## 2018-07-29 LAB — TISSUE TRANSGLUTAMINASE, IGA: Transglutaminase IgA: <2 U/mL (ref 0–3)

## 2018-07-29 LAB — H. PYLORI BREATH TEST: H PYLORI BREATH TEST: NEGATIVE

## 2018-07-29 LAB — IGA: IgA/Immunoglobulin A, Serum: 284 mg/dL (ref 87–352)

## 2018-08-12 ENCOUNTER — Ambulatory Visit: Payer: Self-pay | Admitting: Family Medicine

## 2018-08-16 ENCOUNTER — Ambulatory Visit (INDEPENDENT_AMBULATORY_CARE_PROVIDER_SITE_OTHER): Payer: BLUE CROSS/BLUE SHIELD | Admitting: Family Medicine

## 2018-08-16 DIAGNOSIS — Z23 Encounter for immunization: Secondary | ICD-10-CM

## 2018-08-16 NOTE — Patient Instructions (Signed)
.   Please bring all of your medications to every appointment so we can make sure that our medication list is the same as yours.   

## 2018-08-16 NOTE — Progress Notes (Signed)
Nurse visit only. Administered 2nd dose for Shingrix vaccine.

## 2018-08-25 ENCOUNTER — Encounter: Payer: Self-pay | Admitting: *Deleted

## 2018-08-26 ENCOUNTER — Ambulatory Visit: Payer: BLUE CROSS/BLUE SHIELD | Admitting: Certified Registered"

## 2018-08-26 ENCOUNTER — Ambulatory Visit
Admission: RE | Admit: 2018-08-26 | Discharge: 2018-08-26 | Disposition: A | Discharge status: Home / Self Care | Payer: BLUE CROSS/BLUE SHIELD | Attending: Gastroenterology | Admitting: Gastroenterology

## 2018-08-26 ENCOUNTER — Encounter
Admission: RE | Disposition: A | Discharge status: Home / Self Care | Payer: Self-pay | Source: Home / Self Care | Attending: Gastroenterology

## 2018-08-26 ENCOUNTER — Encounter: Payer: Self-pay | Admitting: Emergency Medicine

## 2018-08-26 DIAGNOSIS — K635 Polyp of colon: Secondary | ICD-10-CM | POA: Diagnosis not present

## 2018-08-26 DIAGNOSIS — Z1211 Encounter for screening for malignant neoplasm of colon: Secondary | ICD-10-CM | POA: Diagnosis present

## 2018-08-26 DIAGNOSIS — Z87891 Personal history of nicotine dependence: Secondary | ICD-10-CM | POA: Insufficient documentation

## 2018-08-26 DIAGNOSIS — Q438 Other specified congenital malformations of intestine: Secondary | ICD-10-CM | POA: Insufficient documentation

## 2018-08-26 DIAGNOSIS — D120 Benign neoplasm of cecum: Secondary | ICD-10-CM | POA: Diagnosis not present

## 2018-08-26 DIAGNOSIS — F419 Anxiety disorder, unspecified: Secondary | ICD-10-CM | POA: Insufficient documentation

## 2018-08-26 DIAGNOSIS — Z7982 Long term (current) use of aspirin: Secondary | ICD-10-CM | POA: Diagnosis not present

## 2018-08-26 DIAGNOSIS — F329 Major depressive disorder, single episode, unspecified: Secondary | ICD-10-CM | POA: Insufficient documentation

## 2018-08-26 DIAGNOSIS — Z79899 Other long term (current) drug therapy: Secondary | ICD-10-CM | POA: Diagnosis not present

## 2018-08-26 HISTORY — PX: COLONOSCOPY WITH PROPOFOL: SHX5780

## 2018-08-26 SURGERY — COLONOSCOPY WITH PROPOFOL
Anesthesia: General

## 2018-08-26 MED ORDER — MIDAZOLAM HCL 2 MG/2ML IJ SOLN
INTRAMUSCULAR | Status: AC
  Filled 2018-08-26: qty 2

## 2018-08-26 MED ORDER — MIDAZOLAM HCL 2 MG/2ML IJ SOLN
INTRAMUSCULAR | Status: DC | PRN
  Administered 2018-08-26: 2 mg via INTRAVENOUS

## 2018-08-26 MED ORDER — PROPOFOL 500 MG/50ML IV EMUL
INTRAVENOUS | Status: AC
  Filled 2018-08-26: qty 50

## 2018-08-26 MED ORDER — PROPOFOL 10 MG/ML IV BOLUS
INTRAVENOUS | Status: AC
  Filled 2018-08-26: qty 20

## 2018-08-26 MED ORDER — PROPOFOL 10 MG/ML IV BOLUS
INTRAVENOUS | Status: DC | PRN
  Administered 2018-08-26 (×2): 30 mg via INTRAVENOUS
  Administered 2018-08-26: 20 mg via INTRAVENOUS

## 2018-08-26 MED ORDER — PROPOFOL 500 MG/50ML IV EMUL
INTRAVENOUS | Status: DC | PRN
  Administered 2018-08-26: 150 ug/kg/min via INTRAVENOUS

## 2018-08-26 MED ORDER — SODIUM CHLORIDE 0.9 % IV SOLN
INTRAVENOUS | Status: DC
  Administered 2018-08-26: 12:00:00 via INTRAVENOUS
  Administered 2018-08-26: 1000 mL via INTRAVENOUS

## 2018-08-26 NOTE — Anesthesia Post-op Follow-up Note (Signed)
Anesthesia QCDR form completed.        

## 2018-08-26 NOTE — Transfer of Care (Signed)
Immediate Anesthesia Transfer of Care Note  [name]  Procedure(s) Performed: COLONOSCOPY WITH PROPOFOL (N/A )  Patient Location: PACU and Endoscopy Unit  Anesthesia Type:General  Level of Consciousness: drowsy and pateint uncooperative  Airway & Oxygen Therapy: Patient connected to face mask oxygen  Post-op Assessment: Report given to RN and Post -op Vital signs reviewed and stable  Post vital signs: Reviewed and stable  Last Vitals:  Vitals Value Taken Time  BP 83/52 08/26/2018 11:36 AM  Temp 36.1 C 08/26/2018 11:36 AM  Pulse 62 08/26/2018 11:36 AM  Resp 18 08/26/2018 11:36 AM  SpO2 99 % 08/26/2018 11:36 AM    Last Pain:  Vitals:   08/26/18 1136  TempSrc: Tympanic  PainSc: Asleep         Complications: No apparent anesthesia complications

## 2018-08-26 NOTE — H&P (Signed)
Cheryl Darby, MD [address]  Cheryl Cooke  [place], [place] [address]  Main: [phone]  Fax: [phone] Pager: [phone]  Primary Care Physician:  Birdie Sons, MD Primary Gastroenterologist:  Dr. Cephas Cooke  Pre-Procedure History & Physical: HPI:  Cheryl Cooke is a 58 y.o. female is here for an colonoscopy.   Past Medical History:  Diagnosis Date  . Absolute anemia 02/26/2015   During pregnancy.   . Anxiety   . Depression   . Dysplasia of cervix, low grade (CIN 1) 2005   OBSERVATION  . H/O: vasectomy    HUSBAND HAS HAD VASECTOMY  . Herpes progenitalis     Past Surgical History:  Procedure Laterality Date  . BREAST SURGERY Bilateral 1979   REDUCTION  . FOOT SURGERY  1979 AND 2002   RIGHT 1979, LEFT 2002; Bunion and hammertoes  . REDUCTION MAMMAPLASTY Bilateral 1978    Prior to Admission medications   Medication Sig Start Date End Date Taking? Authorizing Provider  amitriptyline (ELAVIL) 25 MG tablet Take 1 tablet (25 mg total) by mouth at bedtime. 07/27/18 09/25/18 Yes , Tally Due, MD  aspirin EC 81 MG tablet Take 81 mg by mouth daily.   Yes [provider]  Baricitinib (OLUMIANT) 2 MG TABS Take 1 mg by mouth daily.   Yes [provider]  calcium-vitamin D (OSCAL WITH D) 250-125 MG-UNIT per tablet Take 1 tablet by mouth daily.    Yes [provider]  leflunomide (ARAVA) 20 MG tablet Take 20 mg by mouth daily.    Yes [provider]  Melatonin 2.5 MG CHEW Chew 1 mg by mouth Nightly.   Yes [provider]  pravastatin (PRAVACHOL) 40 MG tablet Take 1 tablet (40 mg total) by mouth daily. 06/14/18  Yes Birdie Sons, MD    Allergies as of 07/27/2018 - Review Complete 07/27/2018  Allergen Reaction Noted  . Clarithromycin Nausea Only 02/26/2011  . Keflex [cephalexin] Nausea Only 05/19/2016  . Levofloxacin  02/26/2015  . Shrimp [shellfish allergy] Swelling 02/26/2011  . Sulfa antibiotics  Swelling 02/26/2011    Family History  Problem Relation Age of Onset  . Diabetes Mother   . Hypertension Mother   . Alzheimer's disease Mother   . Allergies Mother   . Stroke Mother   . Heart attack Mother   . Heart disease Father   . Gout Father   . Heart attack Father   . Cancer Maternal Grandmother        UTERINE CANCER  . Stroke Maternal Grandfather   . Bladder Cancer Neg Hx   . Kidney cancer Neg Hx   . Prostate cancer Neg Hx     Social History   Socioeconomic History  . Marital status: Married    Spouse name: Not on file  . Number of children: 1  . Years of education: H/S  . Highest education level: Not on file  Occupational History  . Occupation: Geophysicist/field seismologist  Social Needs  . Financial resource strain: Not on file  . Food insecurity:    Worry: Not on file    Inability: Not on file  . Transportation needs:    Medical: Not on file    Non-medical: Not on file  Tobacco Use  . Smoking status: Former Smoker    Years: 3.00    Last attempt to quit: 08/11/1983    Years since quitting: 35.0  . Smokeless tobacco: Never Used  . Tobacco comment: was a  social smoker  Substance and Sexual Activity  . Alcohol use: Yes    Alcohol/week: 0.0 standard drinks    Comment: occasionally  . Drug use: No  . Sexual activity: Yes    Birth control/protection: Post-menopausal  Lifestyle  . Physical activity:    Days per week: Not on file    Minutes per session: Not on file  . Stress: Not on file  Relationships  . Social connections:    Talks on phone: Not on file    Gets together: Not on file    Attends religious service: Not on file    Active member of club or organization: Not on file    Attends meetings of clubs or organizations: Not on file    Relationship status: Not on file  . Intimate partner violence:    Fear of current or ex partner: Not on file    Emotionally abused: Not on file    Physically abused: Not on file    Forced sexual activity: Not on file    Other Topics Concern  . Not on file  Social History Narrative  . Not on file    Review of Systems: See HPI, otherwise negative ROS  Physical Exam: BP 129/64   Pulse (!) 58   Temp 97.7 F (36.5 C) (Tympanic)   Resp 16   Ht 5\' 3"  (1.6 m)   Wt 59 kg   LMP  (LMP Unknown)   SpO2 100%   BMI 23.03 kg/m  General:   Alert,  pleasant and cooperative in NAD Head:  Normocephalic and atraumatic. Neck:  Supple; no masses or thyromegaly. Lungs:  Clear throughout to auscultation.    Heart:  Regular rate and rhythm. Abdomen:  Soft, nontender and nondistended. Normal bowel sounds, without guarding, and without rebound.   Neurologic:  Alert and  oriented x4;  grossly normal neurologically.  Impression/Plan: Cheryl Cooke is here for an colonoscopy to be performed for colon cancer screening  Risks, benefits, limitations, and alternatives regarding  colonoscopy have been reviewed with the patient.  Questions have been answered.  All parties agreeable.   Sherri Sear, MD  08/26/2018, 10:53 AM

## 2018-08-26 NOTE — Anesthesia Preprocedure Evaluation (Signed)
Anesthesia Evaluation  Patient identified by MRN, date of birth, ID band Patient awake    Reviewed: Allergy & Precautions, H&P , NPO status , Patient's Chart, lab work & pertinent test results  Airway Mallampati: I  TM Distance: >3 FB     Dental  (+) Teeth Intact   Pulmonary former smoker,           Cardiovascular negative cardio ROS       Neuro/Psych PSYCHIATRIC DISORDERS Anxiety Depression negative neurological ROS     GI/Hepatic Neg liver ROS, GERD  Controlled,  Endo/Other  negative endocrine ROS  Renal/GU negative Renal ROS  negative genitourinary   Musculoskeletal   Abdominal   Peds  Hematology  (+) Blood dyscrasia, anemia ,   Anesthesia Other Findings Past Medical History: 02/26/2015: Absolute anemia     Comment:  During pregnancy.  No date: Anxiety No date: Depression 2005: Dysplasia of cervix, low grade (CIN 1)     Comment:  OBSERVATION No date: H/O: vasectomy     Comment:  HUSBAND HAS HAD VASECTOMY No date: Herpes progenitalis  Past Surgical History: 1979: BREAST SURGERY; Bilateral     Comment:  REDUCTION 1979 AND 2002: FOOT SURGERY     Comment:  RIGHT 1979, LEFT 2002; Bunion and hammertoes 1978: REDUCTION MAMMAPLASTY; Bilateral  BMI    Body Mass Index:  23.03 kg/m      Reproductive/Obstetrics negative OB ROS                             Anesthesia Physical Anesthesia Plan  ASA: II  Anesthesia Plan: General   Post-op Pain Management:    Induction:   PONV Risk Score and Plan: Propofol infusion and TIVA  Airway Management Planned:   Additional Equipment:   Intra-op Plan:   Post-operative Plan:   Informed Consent: I have reviewed the patients History and Physical, chart, labs and discussed the procedure including the risks, benefits and alternatives for the proposed anesthesia with the patient or authorized representative who has indicated his/her  understanding and acceptance.     Dental Advisory Given  Plan Discussed with: Anesthesiologist, CRNA and Surgeon  Anesthesia Plan Comments:         Anesthesia Quick Evaluation

## 2018-08-26 NOTE — Op Note (Signed)
[facility] Gastroenterology Patient Name: Cheryl Cooke Procedure Date: 08/26/2018 11:04 AM MRN: [id number] Account #: [id number] Date of Birth: 11/02/1960 Admit Type: Outpatient Age: 58 Room: [facility] ENDO ROOM 2 Gender: Female Note Status: Finalized Procedure:            Colonoscopy Indications:          Screening for colorectal malignant neoplasm, This is                        the patient's first colonoscopy Providers:            Lin Landsman MD, MD Referring MD:         Kirstie Peri. Caryn Section, MD (Referring MD) Medicines:            Monitored Anesthesia Care Complications:        No immediate complications. Estimated blood loss: None. Procedure:            Pre-Anesthesia Assessment:                       - Prior to the procedure, a History and Physical was                        performed, and patient medications and allergies were                        reviewed. The patient is competent. The risks and                        benefits of the procedure and the sedation options and                        risks were discussed with the patient. All questions                        were answered and informed consent was obtained.                        Patient identification and proposed procedure were                        verified by the physician, the nurse, the                        anesthesiologist, the anesthetist and the technician in                        the pre-procedure area in the procedure room in the                        endoscopy suite. Mental Status Examination: alert and                        oriented. Airway Examination: normal oropharyngeal                        airway and neck mobility. Respiratory Examination:                        clear to auscultation. CV Examination: normal.  Prophylactic Antibiotics: The patient does not require                        prophylactic antibiotics. Prior Anticoagulants: The                         patient has taken no previous anticoagulant or                        antiplatelet agents. ASA Grade Assessment: II - A                        patient with mild systemic disease. After reviewing the                        risks and benefits, the patient was deemed in                        satisfactory condition to undergo the procedure. The                        anesthesia plan was to use monitored anesthesia care                        (MAC). Immediately prior to administration of                        medications, the patient was re-assessed for adequacy                        to receive sedatives. The heart rate, respiratory rate,                        oxygen saturations, blood pressure, adequacy of                        pulmonary ventilation, and response to care were                        monitored throughout the procedure. The physical status                        of the patient was re-assessed after the procedure.                       After obtaining informed consent, the colonoscope was                        passed under direct vision. Throughout the procedure,                        the patient's blood pressure, pulse, and oxygen                        saturations were monitored continuously. The                        Colonoscope was introduced through the anus and  advanced to the the terminal ileum, with identification                        of the appendiceal orifice and IC valve. The                        colonoscopy was technically difficult and complex due                        to a tortuous colon. Successful completion of the                        procedure was aided by applying abdominal pressure. The                        patient tolerated the procedure well. The quality of                        the bowel preparation was evaluated using the BBPS                        [facility] Bowel Preparation Scale) with scores of: Right                         Colon = 2 (minor amount of residual staining, small                        fragments of stool and/or opaque liquid, but mucosa                        seen well), Transverse Colon = 3 (entire mucosa seen                        well with no residual staining, small fragments of                        stool or opaque liquid) and Left Colon = 3 (entire                        mucosa seen well with no residual staining, small                        fragments of stool or opaque liquid). The total BBPS                        score equals 8. Findings:      The perianal and digital rectal examinations were normal. Pertinent       negatives include normal sphincter tone and no palpable rectal lesions.      The terminal ileum appeared normal.      A 5 mm polyp was found in the cecum. The polyp was sessile. The polyp       was removed with a cold snare. Resection and retrieval were complete.      Normal mucosa was found in the entire colon. Biopsies for histology were       taken with a cold forceps from the right colon and left colon for       evaluation of microscopic colitis.  The retroflexed view of the distal rectum and anal verge was normal and       showed no anal or rectal abnormalities. Impression:           - The examined portion of the ileum was normal.                       - One 5 mm polyp in the cecum, removed with a cold                        snare. Resected and retrieved.                       - Normal mucosa in the entire examined colon. Biopsied.                       - The distal rectum and anal verge are normal on                        retroflexion view. Recommendation:       - Discharge patient to home (with escort).                       - Resume previous diet today.                       - Continue present medications.                       - Await pathology results.                       - Repeat colonoscopy in 5 years for surveillance based                         on pathology results.                       - Return to my office as previously scheduled. Procedure Code(s):    --- Professional ---                       [phone], Colonoscopy, flexible; with removal of tumor(s),                        polyp(s), or other lesion(s) by snare technique                       45380, 7, Colonoscopy, flexible; with biopsy, single                        or multiple Diagnosis Code(s):    --- Professional ---                       Z12.11, Encounter for screening for malignant neoplasm                        of colon                       D12.0, Benign neoplasm of cecum CPT copyright 2018 American Medical Association. All rights reserved. The codes documented in this report  are preliminary and upon coder review may  be revised to meet current compliance requirements. Dr. Ulyess Mort Lin Landsman MD, MD 08/26/2018 11:37:22 AM This report has been signed electronically. Number of Addenda: 0 Note Initiated On: 08/26/2018 11:04 AM Scope Withdrawal Time: 0 hours 15 minutes 13 seconds  Total Procedure Duration: 0 hours 22 minutes 31 seconds       [facility]

## 2018-08-27 LAB — SURGICAL PATHOLOGY

## 2018-08-30 ENCOUNTER — Encounter: Payer: Self-pay | Admitting: Gastroenterology

## 2018-09-02 NOTE — Anesthesia Postprocedure Evaluation (Signed)
Anesthesia Post Note  [name]  Procedure(s) Performed: COLONOSCOPY WITH PROPOFOL (N/A )  Patient location during evaluation: PACU Anesthesia Type: General Level of consciousness: awake and alert Pain management: pain level controlled Vital Signs Assessment: post-procedure vital signs reviewed and stable Respiratory status: spontaneous breathing, nonlabored ventilation and respiratory function stable Cardiovascular status: blood pressure returned to baseline and stable Postop Assessment: no apparent nausea or vomiting Anesthetic complications: no     Last Vitals:  Vitals:   08/26/18 1206 08/26/18 1216  BP: (!) 100/57 111/62  Pulse: (!) 52 (!) 55  Resp: 16 11  Temp:    SpO2: 98% 100%    Last Pain:  Vitals:   08/27/18 0757  TempSrc:   PainSc: 0-No pain                 Durenda Hurt

## 2018-09-06 ENCOUNTER — Ambulatory Visit: Payer: BLUE CROSS/BLUE SHIELD | Admitting: Family Medicine

## 2018-09-06 ENCOUNTER — Encounter: Payer: Self-pay | Admitting: Family Medicine

## 2018-09-06 ENCOUNTER — Other Ambulatory Visit: Payer: Self-pay

## 2018-09-06 ENCOUNTER — Encounter: Payer: Self-pay | Admitting: Emergency Medicine

## 2018-09-06 VITALS — BP 116/64 | HR 82 | Temp 97.8°F | Ht 63.0 in | Wt 135.6 lb

## 2018-09-06 DIAGNOSIS — Z7982 Long term (current) use of aspirin: Secondary | ICD-10-CM | POA: Diagnosis not present

## 2018-09-06 DIAGNOSIS — Z87891 Personal history of nicotine dependence: Secondary | ICD-10-CM | POA: Insufficient documentation

## 2018-09-06 DIAGNOSIS — R319 Hematuria, unspecified: Secondary | ICD-10-CM | POA: Insufficient documentation

## 2018-09-06 DIAGNOSIS — N3091 Cystitis, unspecified with hematuria: Secondary | ICD-10-CM

## 2018-09-06 DIAGNOSIS — Z79899 Other long term (current) drug therapy: Secondary | ICD-10-CM | POA: Diagnosis not present

## 2018-09-06 DIAGNOSIS — N10 Acute tubulo-interstitial nephritis: Secondary | ICD-10-CM | POA: Insufficient documentation

## 2018-09-06 DIAGNOSIS — R1032 Left lower quadrant pain: Secondary | ICD-10-CM | POA: Diagnosis present

## 2018-09-06 LAB — CBC WITH DIFFERENTIAL/PLATELET
Abs Immature Granulocytes: 0.02 10*3/uL (ref 0.00–0.07)
BASOS PCT: 1 %
Basophils Absolute: 0.1 10*3/uL (ref 0.0–0.1)
EOS ABS: 0.2 10*3/uL (ref 0.0–0.5)
Eosinophils Relative: 2 %
HEMATOCRIT: 35.8 % — AB (ref 36.0–46.0)
Hemoglobin: 11.6 g/dL — ABNORMAL LOW (ref 12.0–15.0)
Immature Granulocytes: 0 %
LYMPHS ABS: 1.8 10*3/uL (ref 0.7–4.0)
Lymphocytes Relative: 20 %
MCH: 30.4 pg (ref 26.0–34.0)
MCHC: 32.4 g/dL (ref 30.0–36.0)
MCV: 93.7 fL (ref 80.0–100.0)
Monocytes Absolute: 0.6 10*3/uL (ref 0.1–1.0)
Monocytes Relative: 7 %
Neutro Abs: 6.5 10*3/uL (ref 1.7–7.7)
Neutrophils Relative %: 70 %
PLATELETS: 297 10*3/uL (ref 150–400)
RBC: 3.82 MIL/uL — ABNORMAL LOW (ref 3.87–5.11)
RDW: 13.6 % (ref 11.5–15.5)
WBC: 9.2 10*3/uL (ref 4.0–10.5)
nRBC: 0 % (ref 0.0–0.2)

## 2018-09-06 LAB — COMPREHENSIVE METABOLIC PANEL
ALT: 21 U/L (ref 0–44)
AST: 33 U/L (ref 15–41)
Albumin: 3.9 g/dL (ref 3.5–5.0)
Alkaline Phosphatase: 60 U/L (ref 38–126)
Anion gap: 9 (ref 5–15)
BUN: 21 mg/dL — AB (ref 6–20)
CO2: 26 mmol/L (ref 22–32)
Calcium: 9.4 mg/dL (ref 8.9–10.3)
Chloride: 102 mmol/L (ref 98–111)
Creatinine, Ser: 1.55 mg/dL — ABNORMAL HIGH (ref 0.44–1.00)
GFR calc Af Amer: 42 mL/min — ABNORMAL LOW (ref >60)
GFR calc non Af Amer: 37 mL/min — ABNORMAL LOW (ref >60)
Glucose, Bld: 141 mg/dL — ABNORMAL HIGH (ref 70–99)
Potassium: 4.6 mmol/L (ref 3.5–5.1)
Sodium: 137 mmol/L (ref 135–145)
Total Bilirubin: 0.5 mg/dL (ref 0.3–1.2)
Total Protein: 7 g/dL (ref 6.5–8.1)

## 2018-09-06 LAB — POCT URINALYSIS DIPSTICK
Bilirubin, UA: NEGATIVE
Glucose, UA: NEGATIVE
Ketones, UA: NEGATIVE
Nitrite, UA: NEGATIVE
Protein, UA: POSITIVE — AB
Spec Grav, UA: 1.01 (ref 1.010–1.025)
Urobilinogen, UA: 0.2 E.U./dL
pH, UA: 6 (ref 5.0–8.0)

## 2018-09-06 LAB — URINALYSIS, COMPLETE (UACMP) WITH MICROSCOPIC
Bacteria, UA: NONE SEEN
Bilirubin Urine: NEGATIVE
Glucose, UA: NEGATIVE mg/dL
Ketones, ur: NEGATIVE mg/dL
Nitrite: NEGATIVE
Protein, ur: 100 mg/dL — AB
Specific Gravity, Urine: 1.029 (ref 1.005–1.030)
WBC, UA: >50 WBC/hpf — ABNORMAL HIGH (ref 0–5)
pH: 6 (ref 5.0–8.0)

## 2018-09-06 MED ORDER — AMOXICILLIN 500 MG PO CAPS: 500.0000 mg | ORAL_CAPSULE | Freq: Two times a day (BID) | ORAL | 0 refills | Status: DC

## 2018-09-06 NOTE — Patient Instructions (Signed)
We will call you with the culture result. 

## 2018-09-06 NOTE — ED Triage Notes (Signed)
Pt to triage via w/c with no distress noted; pt reports seen today for episode of hematuria; rx amoxicillin for UTI (took 1 ds PTA); c/o increasing abd cramping, more of left side radiating into left flank

## 2018-09-06 NOTE — Progress Notes (Signed)
  Subjective:     Patient ID: Cheryl Cooke, female   DOB: 08-17-1960, 58 y.o.   MRN: [id number] Chief Complaint  Patient presents with  . Hematuria    pt noticed when she urinated this morning the water was colored and when she came home from work and urinated the water was a dark color.  she did notice some back pain last night  . Abdominal Pain    some   HPI Reports dysuria with groin and low back discomfort. Last UCX 10/2017 with [facility].  Review of Systems     Objective:   Physical Exam Constitutional:      General: She is not in acute distress.    Appearance: She is not ill-appearing.  Genitourinary:    Comments: No cva tenderness Neurological:     Mental Status: She is alert.        Assessment:    1. Cystitis with hematuria: Amoxil  500 bid - POCT Urinalysis Dipstick - Urine Culture    Plan:    Further f/u pending urine culture result.

## 2018-09-07 ENCOUNTER — Emergency Department: Payer: BLUE CROSS/BLUE SHIELD

## 2018-09-07 ENCOUNTER — Emergency Department
Admission: EM | Admit: 2018-09-07 | Discharge: 2018-09-07 | Disposition: A | Discharge status: Home / Self Care | Payer: BLUE CROSS/BLUE SHIELD | Attending: Emergency Medicine | Admitting: Emergency Medicine

## 2018-09-07 DIAGNOSIS — N10 Acute tubulo-interstitial nephritis: Secondary | ICD-10-CM

## 2018-09-07 MED ORDER — CIPROFLOXACIN HCL 500 MG PO TABS
500.0000 mg | ORAL_TABLET | Freq: Once | ORAL | Status: AC
  Administered 2018-09-07: 500 mg via ORAL
  Filled 2018-09-07: qty 1

## 2018-09-07 MED ORDER — MORPHINE SULFATE (PF) 2 MG/ML IV SOLN
2.0000 mg | Freq: Once | INTRAVENOUS | Status: AC
  Administered 2018-09-07: 2 mg via INTRAVENOUS
  Filled 2018-09-07: qty 1

## 2018-09-07 MED ORDER — ONDANSETRON 4 MG PO TBDP: 4.0000 mg | ORAL_TABLET | Freq: Three times a day (TID) | ORAL | 0 refills | Status: DC | PRN

## 2018-09-07 MED ORDER — ONDANSETRON HCL 4 MG/2ML IJ SOLN
4.0000 mg | Freq: Once | INTRAMUSCULAR | Status: AC
  Administered 2018-09-07: 4 mg via INTRAVENOUS
  Filled 2018-09-07: qty 2

## 2018-09-07 MED ORDER — CIPROFLOXACIN HCL 500 MG PO TABS: 500.0000 mg | ORAL_TABLET | Freq: Two times a day (BID) | ORAL | 0 refills | Status: DC

## 2018-09-07 NOTE — ED Provider Notes (Signed)
[facility] Emergency Department Provider Note  ____________________________________________   First MD Initiated Contact with Patient 09/07/18 0025     (approximate)  I have reviewed the triage vital signs and the nursing notes.   HISTORY  Chief Complaint Abdominal Pain    HPI Cheryl Cooke is a 58 y.o. female below list of chronic medical conditions including his urinary tract infections presents to the emergency department with recent diagnosis of UTI for which patient was prescribed amoxicillin.  Patient states she took her first dose before arrival to the emergency department.  Patient states that she presented secondary to sharp left lower quadrant/flank pain and an episode of hematuria which she noted today.  Patient denies any fever afebrile on presentation.  Patient denies any dysuria no urinary frequency or urgency.   Past Medical History:  Diagnosis Date  . Absolute anemia 02/26/2015   During pregnancy.   . Anxiety   . Depression   . Dysplasia of cervix, low grade (CIN 1) 2005   OBSERVATION  . H/O: vasectomy    HUSBAND HAS HAD VASECTOMY  . Herpes progenitalis     Patient Active Problem List   Diagnosis Date Noted  . Hematuria 09/06/2018  . Colon cancer screening   . Immunocompromised state due to drug therapy 06/10/2018  . Allergic rhinitis 02/26/2015  . Clinical depression 02/26/2015  . Acid reflux 02/26/2015  . Hypercholesteremia 02/26/2015  . IBS (irritable bowel syndrome) 02/26/2015  . Late luteal phase dysphoric disorder (LLPDD) 02/26/2015  . Arthritis or polyarthritis, rheumatoid ([place]) 02/26/2015  . Gougerout-Sjoegren syndrome 02/26/2015  . Herpes progenitalis     Past Surgical History:  Procedure Laterality Date  . BREAST SURGERY Bilateral 1979   REDUCTION  . COLONOSCOPY WITH PROPOFOL N/A 08/26/2018   Procedure: COLONOSCOPY WITH PROPOFOL;  Surgeon: Lin Landsman, MD;  Location: [facility] ENDOSCOPY;  Service:  Gastroenterology;  Laterality: N/A;  . FOOT SURGERY  1979 AND 2002   RIGHT 1979, LEFT 2002; Bunion and hammertoes  . REDUCTION MAMMAPLASTY Bilateral 1978    Prior to Admission medications   Medication Sig Start Date End Date Taking? Authorizing Provider  amoxicillin (AMOXIL) 500 MG capsule Take 1 capsule (500 mg total) by mouth 2 (two) times daily. 09/06/18   Carmon Ginsberg, PA  aspirin EC 81 MG tablet Take 81 mg by mouth daily.    [provider]  Baricitinib (OLUMIANT) 2 MG TABS Take 1 mg by mouth daily.    [provider]  calcium-vitamin D (OSCAL WITH D) 250-125 MG-UNIT per tablet Take 1 tablet by mouth daily.     [provider]  ciprofloxacin (CIPRO) 500 MG tablet Take 1 tablet (500 mg total) by mouth 2 (two) times daily for 10 days. 09/07/18 09/17/18  Gregor Hams, MD  leflunomide (ARAVA) 20 MG tablet Take 20 mg by mouth daily.     [provider]  Melatonin 2.5 MG CHEW Chew 1 mg by mouth Nightly.    [provider]  ondansetron (ZOFRAN ODT) 4 MG disintegrating tablet Take 1 tablet (4 mg total) by mouth every 8 (eight) hours as needed. 09/07/18   Gregor Hams, MD  pravastatin (PRAVACHOL) 40 MG tablet Take 1 tablet (40 mg total) by mouth daily. 06/14/18   Birdie Sons, MD    Allergies Clarithromycin; Keflex [cephalexin]; Levofloxacin; Shrimp [shellfish allergy]; and Sulfa antibiotics  Family History  Problem Relation Age of Onset  . Diabetes Mother   . Hypertension Mother   .  Alzheimer's disease Mother   . Allergies Mother   . Stroke Mother   . Heart attack Mother   . Heart disease Father   . Gout Father   . Heart attack Father   . Cancer Maternal Grandmother        UTERINE CANCER  . Stroke Maternal Grandfather   . Bladder Cancer Neg Hx   . Kidney cancer Neg Hx   . Prostate cancer Neg Hx     Social History Social History   Tobacco Use  . Smoking status: Former Smoker    Years: 3.00    Last attempt to quit:  08/11/1983    Years since quitting: 35.0  . Smokeless tobacco: Never Used  . Tobacco comment: was a social smoker  Substance Use Topics  . Alcohol use: Yes    Alcohol/week: 0.0 standard drinks    Comment: occasionally  . Drug use: No    Review of Systems Constitutional: No fever/chills Eyes: No visual changes. ENT: No sore throat. Cardiovascular: Denies chest pain. Respiratory: Denies shortness of breath. Gastrointestinal: No abdominal pain.  No nausea, no vomiting.  No diarrhea.  No constipation. Genitourinary: Negative for dysuria. Musculoskeletal: Negative for neck pain.  Negative for back pain. Integumentary: Negative for rash. Neurological: Negative for headaches, focal weakness or numbness.   ____________________________________________   PHYSICAL EXAM:  VITAL SIGNS: ED Triage Vitals  Enc Vitals Group     BP 09/06/18 2037 (!) 155/71     Pulse Rate 09/06/18 2037 78     Resp 09/06/18 2037 20     Temp 09/06/18 2037 98.3 F (36.8 C)     Temp Source 09/06/18 2037 Oral     SpO2 09/06/18 2037 99 %     Weight 09/06/18 2038 61.2 kg (135 lb)     Height 09/06/18 2038 1.6 m (5\' 3" )     Head Circumference --      Peak Flow --      Pain Score 09/06/18 2040 6     Pain Loc --      Pain Edu? --      Excl. in [place]? --     Constitutional: Alert and oriented. Well appearing and in no acute distress. Eyes: Conjunctivae are normal.  Mouth/Throat: Mucous membranes are moist.  Oropharynx non-erythematous. Neck: No stridor.   Cardiovascular: Normal rate, regular rhythm. Good peripheral circulation. Grossly normal heart sounds. Respiratory: Normal respiratory effort.  No retractions. Lungs CTAB. Gastrointestinal: Soft and nontender. No distention.  Left CVA tenderness. Musculoskeletal: No lower extremity tenderness nor edema. No gross deformities of extremities. Neurologic:  Normal speech and language. No gross focal neurologic deficits are appreciated.  Skin:  Skin is warm, dry  and intact. No rash noted. Psychiatric: Mood and affect are normal. Speech and behavior are normal.  ____________________________________________   LABS (all labs ordered are listed, but only abnormal results are displayed)  Labs Reviewed  CBC WITH DIFFERENTIAL/PLATELET - Abnormal; Notable for the following components:      Result Value   RBC 3.82 (*)    Hemoglobin 11.6 (*)    HCT 35.8 (*)    All other components within normal limits  COMPREHENSIVE METABOLIC PANEL - Abnormal; Notable for the following components:   Glucose, Bld 141 (*)    BUN 21 (*)    Creatinine, Ser 1.55 (*)    GFR calc non Af Amer 37 (*)    GFR calc Af Amer 42 (*)    All other components within normal limits  URINALYSIS, COMPLETE (UACMP) WITH MICROSCOPIC - Abnormal; Notable for the following components:   Color, Urine AMBER (*)    APPearance CLOUDY (*)    Hgb urine dipstick LARGE (*)    Protein, ur 100 (*)    Leukocytes, UA MODERATE (*)    RBC / HPF >50 (*)    WBC, UA >50 (*)    All other components within normal limits  URINE CULTURE   _________________________________  RADIOLOGY I, Honor N Nishtha Raider, personally viewed and evaluated these images (plain radiographs) as part of my medical decision making, as well as reviewing the written report by the radiologist.  ED MD interpretation: No acute abdomen or pelvic abnormality.  Official radiology report(s): Ct Renal Stone Study  Result Date: 09/07/2018 CLINICAL DATA:  Hematuria EXAM: CT ABDOMEN AND PELVIS WITHOUT CONTRAST TECHNIQUE: Multidetector CT imaging of the abdomen and pelvis was performed following the standard protocol without IV contrast. COMPARISON:  None. FINDINGS: LOWER CHEST: There is no basilar pleural or apical pericardial effusion. HEPATOBILIARY: The hepatic contours and density are normal. There is no intra- or extrahepatic biliary dilatation. The gallbladder is normal. PANCREAS: The pancreatic parenchymal contours are normal and there is  no ductal dilatation. There is no peripancreatic fluid collection. SPLEEN: Normal. ADRENALS/URINARY TRACT: --Adrenal glands: Normal. --Right kidney/ureter: No hydronephrosis, nephroureterolithiasis, perinephric stranding or solid renal mass. --Left kidney/ureter: No hydronephrosis, nephroureterolithiasis, perinephric stranding or solid renal mass. --Urinary bladder: Normal for degree of distention STOMACH/BOWEL: --Stomach/Duodenum: There is no hiatal hernia or other gastric abnormality. The duodenal course and caliber are normal. --Small bowel: No dilatation or inflammation. --Colon: No focal abnormality. --Appendix: Normal. VASCULAR/LYMPHATIC: There is aortic atherosclerosis without hemodynamically significant stenosis. No abdominal or pelvic lymphadenopathy. REPRODUCTIVE: Normal uterus and ovaries. MUSCULOSKELETAL. No bony spinal canal stenosis or focal osseous abnormality. OTHER: None. IMPRESSION: No acute abdominal or pelvic abnormality. Aortic atherosclerosis (ICD10-I70.0). Electronically Signed   By: Ulyses Jarred M.D.   On: 09/07/2018 01:03     Procedures   ____________________________________________   INITIAL IMPRESSION / ASSESSMENT AND PLAN / ED COURSE  As part of my medical decision making, I reviewed the following data within the electronic MEDICAL RECORD NUMBER 58 year old female presented with above-stated history and physical exam concerning for possible acute pyelonephritis ureterolithiasis less likely diverticulitis and cystitis.  Laboratory data notable for urine consistent with UTI possible pyelonephritis.  CT scan was performed to evaluate for possible kidney stone versus other potential diagnoses stated.  CT revealed no acute abnormality.  I reviewed the patient's chart which revealed multiple urine cultures which are positive for Proteus mirabilis.  Based on the patient's allergy profile ciprofloxacin was administered.  Patient will be prescribed ciprofloxacin for home.     ____________________________________________  FINAL CLINICAL IMPRESSION(S) / ED DIAGNOSES  Final diagnoses:  Pyelonephritis, acute     MEDICATIONS GIVEN DURING THIS VISIT:  Medications  ciprofloxacin (CIPRO) tablet 500 mg (has no administration in time range)  morphine 2 MG/ML injection 2 mg (2 mg Intravenous Given 09/07/18 0105)  ondansetron (ZOFRAN) injection 4 mg (4 mg Intravenous Given 09/07/18 0105)     ED Discharge Orders         Ordered    ciprofloxacin (CIPRO) 500 MG tablet  2 times daily     09/07/18 0124    ondansetron (ZOFRAN ODT) 4 MG disintegrating tablet  Every 8 hours PRN     09/07/18 0124           Note:  This document was prepared using Dragon voice  recognition software and may include unintentional dictation errors.    Gregor Hams, MD 09/07/18 [id number]

## 2018-09-08 ENCOUNTER — Telehealth: Payer: Self-pay

## 2018-09-08 LAB — URINE CULTURE: Culture: NO GROWTH

## 2018-09-08 NOTE — Telephone Encounter (Signed)
-----   Message from Carmon Ginsberg, [place] sent at 09/08/2018  7:51 AM EST ----- Let her know that they had to reincubate her urine culture so it may take a bit longer to get results.

## 2018-09-08 NOTE — Telephone Encounter (Signed)
-----   Message from Carmon Ginsberg, [place] sent at 09/08/2018 11:49 AM EST ----- She has a lactobacillus species which usually responds to the penicillin family. Is she improving with amoxicillin.

## 2018-09-08 NOTE — Telephone Encounter (Signed)
Patient has been advised. KW 

## 2018-09-08 NOTE — Telephone Encounter (Signed)
Patient states after seeing you in office that day pain increased and her husband took her to ED. Patient states that CT and labs were drawn along with a urinalysis. Patient states that she was prescribed Cipro from Ed doctor and never started amoxicillin. Patient complains still of pain with urination.KW

## 2018-09-09 ENCOUNTER — Other Ambulatory Visit: Payer: Self-pay | Admitting: Family Medicine

## 2018-09-09 DIAGNOSIS — R319 Hematuria, unspecified: Secondary | ICD-10-CM

## 2018-09-09 NOTE — Telephone Encounter (Signed)
[id number] 

## 2018-09-09 NOTE — Telephone Encounter (Signed)
Patient advised please proceed with referral to Urology, patient states that she has seen Dr. Bernardo Heater before. KW

## 2018-09-09 NOTE — Telephone Encounter (Signed)
The urine culture at the ER did not grow any organism. There is no reason to stay on the Cipro unless you feel it is helping. I would like to send you to a urologist if you are not feeling better. Do you wish referral?

## 2018-09-09 NOTE — Telephone Encounter (Signed)
Urgent referral is in progress

## 2018-09-10 ENCOUNTER — Encounter: Payer: Self-pay | Admitting: Family Medicine

## 2018-09-10 ENCOUNTER — Telehealth: Payer: Self-pay | Admitting: Family Medicine

## 2018-09-10 ENCOUNTER — Other Ambulatory Visit: Payer: Self-pay

## 2018-09-10 ENCOUNTER — Encounter: Payer: Self-pay | Admitting: Gastroenterology

## 2018-09-10 ENCOUNTER — Ambulatory Visit: Payer: BLUE CROSS/BLUE SHIELD | Admitting: Family Medicine

## 2018-09-10 ENCOUNTER — Ambulatory Visit: Payer: BLUE CROSS/BLUE SHIELD | Admitting: Gastroenterology

## 2018-09-10 VITALS — BP 149/76 | HR 87 | Resp 17 | Ht 63.0 in | Wt 134.4 lb

## 2018-09-10 VITALS — BP 116/64 | HR 76 | Temp 98.7°F | Resp 16 | Wt 133.0 lb

## 2018-09-10 DIAGNOSIS — E7800 Pure hypercholesterolemia, unspecified: Secondary | ICD-10-CM | POA: Diagnosis not present

## 2018-09-10 DIAGNOSIS — N12 Tubulo-interstitial nephritis, not specified as acute or chronic: Secondary | ICD-10-CM

## 2018-09-10 DIAGNOSIS — F4310 Post-traumatic stress disorder, unspecified: Secondary | ICD-10-CM | POA: Diagnosis not present

## 2018-09-10 DIAGNOSIS — N390 Urinary tract infection, site not specified: Secondary | ICD-10-CM | POA: Insufficient documentation

## 2018-09-10 DIAGNOSIS — K580 Irritable bowel syndrome with diarrhea: Secondary | ICD-10-CM | POA: Diagnosis not present

## 2018-09-10 LAB — POCT URINALYSIS DIPSTICK
Bilirubin, UA: NEGATIVE
Glucose, UA: NEGATIVE
Ketones, UA: NEGATIVE
Leukocytes, UA: NEGATIVE
NITRITE UA: NEGATIVE
PROTEIN UA: NEGATIVE
Spec Grav, UA: 1.015 (ref 1.010–1.025)
Urobilinogen, UA: 0.2 E.U./dL
pH, UA: 6 (ref 5.0–8.0)

## 2018-09-10 MED ORDER — DICYCLOMINE HCL 10 MG PO CAPS: 10.0000 mg | ORAL_CAPSULE | Freq: Three times a day (TID) | ORAL | 0 refills | Status: DC

## 2018-09-10 MED ORDER — SERTRALINE HCL 25 MG PO TABS: 25.0000 mg | ORAL_TABLET | Freq: Every day | ORAL | 0 refills | Status: DC

## 2018-09-10 NOTE — Patient Instructions (Signed)
.   Please review the attached list of medications and notify my office if there are any errors.   . Please bring all of your medications to every appointment so we can make sure that our medication list is the same as yours.   

## 2018-09-10 NOTE — Telephone Encounter (Signed)
Patient was seen today and decided she does not want urology referral that was entered earlier this week. I cancelled order. Thanks.

## 2018-09-10 NOTE — Progress Notes (Signed)
Cephas Darby, MD [address]  [place]  [place], [place] [address]  Main: [phone]  Fax: [phone]    Gastroenterology Consultation  Referring Provider:     Birdie Sons, MD Primary Care Physician:  Birdie Sons, MD Primary Gastroenterologist:  Dr. Cephas Darby Reason for Consultation:     Irritable bowel syndrome        HPI:   Cheryl Cooke is a 58 y.o. female referred by Dr. Birdie Sons, MD  for consultation & management of approximately 2 years history of abdominal pain associated with loose stools, abdominal bloating and mild abdominal cramps.  She reports that for the last 1 to 2 years, she has been going through a lot of stress in her family and has been taking care of her husband has been sick for few months and was hospitalized.  She developed PTSD due to being in the hospital with her husband.  She has been undergoing cognitive behavioral therapy.  Patient cannot became tearful that her symptoms were bothering her and she had to restrict diet.  She has been avoiding bread to help with her symptoms.  However, her weight has been stable.  She reports insomnia, takes melatonin at bedtime. She has chronic abdominal pain and underwent sigmoidoscopy back in 1990s and she was told that her colon was loopy and could not undergo full colonoscopy.  Patient has been undergoing annual FOBT for colon cancer screening and has been negative to date.  She denies any other GI symptoms Patient denies alcohol use or smoking tobacco  She has history of Sjogren's syndrome and rheumatoid arthritis, followed by rheumatologist in [place].  Currently on maintenance medication with baricitinib and leflunomide  Follow-up visit 09/10/2018 H. pylori breath test is negative, celiac serologies negative, colonoscopy unremarkable.  She tried amitriptyline 12.5 mg which led to constipation.  She is concerned about abdominal pain.  She is a massage therapist by profession and has  been undergoing various techniques which have tremendously helped her with PTSD but she still has some residual symptoms and would like to know if anything could be done for her abdominal pain.  She and her husband are seeing a counselor but she has not seen a psychologist or psychiatrist for PTSD.  NSAIDs: None  Antiplts/Anticoagulants/Anti thrombotics: None  GI Procedures: Sigmoidoscopy 1990s  - The examined portion of the ileum was normal. - One 5 mm polyp in the cecum, removed with a cold snare. Resected and retrieved. - Normal mucosa in the entire examined colon. Biopsied. - The distal rectum and anal verge are normal on retroflexion view.  DIAGNOSIS:  A. COLON POLYP, CECUM; COLD SNARE:  - COLONIC MUCOSA WITH PROMINENT LYMPHOID AGGREGATE.  - NEGATIVE FOR MICROSCOPIC COLITIS, DYSPLASIA, AND MALIGNANCY  (ADDITIONAL DEEPER SECTIONS REVIEWED).   B. COLON; RANDOM COLD BIOPSY:  - COLONIC MUCOSA WITH INTACT CRYPT ARCHITECTURE.  - NEGATIVE FOR ACTIVE INFLAMMATION, MICROSCOPIC COLITIS, AND DYSPLASIA.    Past Medical History:  Diagnosis Date  . Absolute anemia 02/26/2015   During pregnancy.   . Anxiety   . Depression   . Dysplasia of cervix, low grade (CIN 1) 2005   OBSERVATION  . H/O: vasectomy    HUSBAND HAS HAD VASECTOMY  . Herpes progenitalis     Past Surgical History:  Procedure Laterality Date  . BREAST SURGERY Bilateral 1979   REDUCTION  . COLONOSCOPY WITH PROPOFOL N/A 08/26/2018   Procedure: COLONOSCOPY WITH PROPOFOL;  Surgeon: Lin Landsman, MD;  Location: [facility] ENDOSCOPY;  Service: Gastroenterology;  Laterality: N/A;  . FOOT SURGERY  1979 AND 2002   RIGHT 1979, LEFT 2002; Bunion and hammertoes  . REDUCTION MAMMAPLASTY Bilateral 1978    Current Outpatient Medications:  .  amoxicillin (AMOXIL) 500 MG capsule, Take 1 capsule (500 mg total) by mouth 2 (two) times daily., Disp: 14 capsule, Rfl: 0 .  aspirin EC 81 MG tablet, Take 81 mg by mouth daily., Disp: ,  Rfl:  .  Baricitinib (OLUMIANT) 2 MG TABS, Take 1 mg by mouth daily., Disp: , Rfl:  .  calcium-vitamin D (OSCAL WITH D) 250-125 MG-UNIT per tablet, Take 1 tablet by mouth daily. , Disp: , Rfl:  .  EPINEPHrine 0.3 mg/0.3 mL IJ SOAJ injection, U UTD, Disp: , Rfl:  .  fluticasone (FLONASE) 50 MCG/ACT nasal spray, , Disp: , Rfl:  .  leflunomide (ARAVA) 20 MG tablet, Take 20 mg by mouth daily. , Disp: , Rfl:  .  dicyclomine (BENTYL) 10 MG capsule, Take 1 capsule (10 mg total) by mouth 4 (four) times daily -  before meals and at bedtime for 30 doses., Disp: 30 capsule, Rfl: 0 .  Melatonin 2.5 MG CHEW, Chew 1 mg by mouth Nightly., Disp: , Rfl:  .  ondansetron (ZOFRAN ODT) 4 MG disintegrating tablet, Take 1 tablet (4 mg total) by mouth every 8 (eight) hours as needed. (Patient not taking: Reported on 09/10/2018), Disp: 20 tablet, Rfl: 0 .  pravastatin (PRAVACHOL) 40 MG tablet, Take 1 tablet (40 mg total) by mouth daily. (Patient not taking: Reported on 09/10/2018), Disp: 30 tablet, Rfl: 3 .  sertraline (ZOLOFT) 25 MG tablet, Take 1 tablet (25 mg total) by mouth daily for 30 days., Disp: 30 tablet, Rfl: 0    Family History  Problem Relation Age of Onset  . Diabetes Mother   . Hypertension Mother   . Alzheimer's disease Mother   . Allergies Mother   . Stroke Mother   . Heart attack Mother   . Heart disease Father   . Gout Father   . Heart attack Father   . Cancer Maternal Grandmother        UTERINE CANCER  . Stroke Maternal Grandfather   . Bladder Cancer Neg Hx   . Kidney cancer Neg Hx   . Prostate cancer Neg Hx      Social History   Tobacco Use  . Smoking status: Former Smoker    Years: 3.00    Last attempt to quit: 08/11/1983    Years since quitting: 35.1  . Smokeless tobacco: Never Used  . Tobacco comment: was a social smoker  Substance Use Topics  . Alcohol use: Yes    Alcohol/week: 0.0 standard drinks    Comment: occasionally  . Drug use: No    Allergies as of 09/10/2018 -  Review Complete 09/10/2018  Allergen Reaction Noted  . Clarithromycin Nausea Only 02/26/2011  . Keflex [cephalexin] Nausea Only 05/19/2016  . Levofloxacin  02/26/2015  . Shrimp [shellfish allergy] Swelling 02/26/2011  . Sulfa antibiotics Swelling 02/26/2011    Review of Systems:    All systems reviewed and negative except where noted in HPI.   Physical Exam:  BP (!) 149/76 (BP Location: Left Arm, Patient Position: Sitting, Cuff Size: Normal)   Pulse 87   Resp 17   Ht 5\' 3"  (1.6 m)   Wt 134 lb 6.4 oz (61 kg)   LMP  (LMP Unknown)   SpO2 97%   BMI 23.81  kg/m  No LMP recorded (lmp unknown). Patient is postmenopausal.  General:   Alert,  Well-developed, well-nourished, pleasant and cooperative in NAD Head:  Normocephalic and atraumatic. Eyes:  Sclera clear, no icterus.   Conjunctiva pink. Ears:  Normal auditory acuity. Nose:  No deformity, discharge, or lesions. Mouth:  No deformity or lesions,oropharynx pink & moist. Neck:  Supple; no masses or thyromegaly. Lungs:  Respirations even and unlabored.  Clear throughout to auscultation.   No wheezes, crackles, or rhonchi. No acute distress. Heart:  Regular rate and rhythm; no murmurs, clicks, rubs, or gallops. Abdomen:  Normal bowel sounds. Soft, non-tender and non-distended without masses, hepatosplenomegaly or hernias noted.  No guarding or rebound tenderness.   Rectal: Not performed Msk:  Symmetrical, deformities in her bilateral hands from rheumatoid arthritis. Good, equal movement & strength bilaterally. Pulses:  Normal pulses noted. Extremities:  No clubbing or edema.  No cyanosis. Neurologic:  Alert and oriented x3;  grossly normal neurologically. Skin:  Intact without significant lesions or rashes. No jaundice. Psych:  Alert and cooperative. Normal mood and affect.  Imaging Studies: No abdominal imaging  Assessment and Plan:   Cheryl Cooke is a 58 y.o. Caucasian female with history of rheumatoid arthritis,  Sjogren's, chronic history of abdominal pain, cramps associated with nonbloody, loose stools.  She does not have any constitutional symptoms.  She has been going through significant stress in last 22 years, history of PTSD and undergoing cognitive behavioral therapy.  Her history is highly consistent with IBS diarrhea in setting of stress  IBS-diarrhea: -H. pylori breath test negative -Celiac serologies negative -She did not tolerate low-dose amitriptyline -Recommend trial of Zoloft and patient is willing to try  -She will also try Bentyl as needed for abdominal cramps  -Advised her to see psychologist  Colon cancer screening Colonoscopy normal, recommend screening in 10 years  Follow up in 3 months   Cephas Darby, MD

## 2018-09-10 NOTE — Progress Notes (Addendum)
[name] Female    DOB: 1961-04-21   58 y.o.   MRN: [id number] Visit Date: 09/10/2018  Today's Provider: Lelon Huh, MD   Chief Complaint  Patient presents with  . Follow-up    ER 09/07/2018 for Pyelonephritis  . Hyperlipidemia   Subjective:     HPI     Follow up ER visit  She was seen here on 09/06/2018 with UTI and prescribed amoxicillin Patient was seen in ER for 09/07/2018 on abdominal pain. She had normal WBC and met c panel and normal CT renal stone study She was treated for Pyelonephritis and given Cipro tablet, IV morphine and IV ondansetron and prescription for ciprofloxacin. She took 4 ciprofloxacin tablets before changing back to amoxicillin yesterday based on urine cultures as below.  She reports this condition is Unchanged.  Cultures from 09/06/2018 grew 25-50K lactobacillus with no sensitivities done and she was advised to finish course of amoxicillin. She had switched to ciprofloxacin after ER visit above, but switched back to amoxicillin yesterday and feels a little better today.  Culture drawn from ER on 09-07-2018 had no growth.  She was advised to be referred to urology, but she states she had thorough evaluation by Dr. Erlene Quan a few years ago and doesn't feel there is anything more that can be done.  ------------------------------------------------------------------------------------    Lipid/Cholesterol, Follow-up:   Last seen for this 2 months ago.  Management since that visit includes Started Pravastatin 86m daily.  Last Lipid Panel:    Component Value Date/Time   CHOL 284 (H) 06/11/2018 0807   TRIG 78 06/11/2018 0807   HDL 97 06/11/2018 0807   CHOLHDL 2.9 06/11/2018 0807   CHOLHDL 2.7 06/05/2017 1000   LDLCALC 171 (H) 06/11/2018 0807   LDLCALC 129 (H) 06/05/2017 1000    She reports excellent compliance with treatment. She is not having side effects.   Wt Readings from Last 3 Encounters:  09/10/18 133 lb (60.3  kg)  09/06/18 135 lb (61.2 kg)  09/06/18 135 lb 9.6 oz (61.5 kg)      ------------------------------------------------------------------------     Allergies  Allergen Reactions  . Clarithromycin Nausea Only  . Keflex [Cephalexin] Nausea Only    Dizziness and stomach pain  . Levofloxacin     Other reaction(s): Nausea  . Shrimp [Shellfish Allergy] Swelling  . Sulfa Antibiotics Swelling     Current Outpatient Medications:  .  amoxicillin (AMOXIL) 500 MG capsule, Take 1 capsule (500 mg total) by mouth 2 (two) times daily., Disp: 14 capsule, Rfl: 0 .  aspirin EC 81 MG tablet, Take 81 mg by mouth daily., Disp: , Rfl:  .  Baricitinib (OLUMIANT) 2 MG TABS, Take 1 mg by mouth daily., Disp: , Rfl:  .  calcium-vitamin D (OSCAL WITH D) 250-125 MG-UNIT per tablet, Take 1 tablet by mouth daily. , Disp: , Rfl:  .  fluticasone (FLONASE) 50 MCG/ACT nasal spray, , Disp: , Rfl:  .  leflunomide (ARAVA) 20 MG tablet, Take 20 mg by mouth daily. , Disp: , Rfl:  .  Melatonin 2.5 MG CHEW, Chew 1 mg by mouth Nightly., Disp: , Rfl:  .  pravastatin (PRAVACHOL) 40 MG tablet, Take 1 tablet (40 mg total) by mouth daily., Disp: 30 tablet, Rfl: 3 .  ciprofloxacin (CIPRO) 500 MG tablet, Take 1 tablet (500 mg total) by mouth 2 (two) times daily for 10 days. (Patient not taking: Reported on 09/10/2018), Disp: 20 tablet, Rfl: 0 .  ondansetron (ZOFRAN ODT) 4 MG disintegrating tablet, Take 1 tablet (4 mg total) by mouth every 8 (eight) hours as needed. (Patient not taking: Reported on 09/10/2018), Disp: 20 tablet, Rfl: 0  Review of Systems  Constitutional: Positive for fatigue. Negative for activity change, appetite change, chills, diaphoresis, fever and unexpected weight change.  Respiratory: Negative.   Cardiovascular: Negative.   Gastrointestinal: Positive for abdominal pain. Negative for abdominal distention, anal bleeding, blood in stool, constipation, diarrhea, nausea, rectal pain and vomiting.  Genitourinary:  Negative.   Neurological: Negative for dizziness, light-headedness and headaches.    Social History   Tobacco Use  . Smoking status: Former Smoker    Years: 3.00    Last attempt to quit: 08/11/1983    Years since quitting: 35.1  . Smokeless tobacco: Never Used  . Tobacco comment: was a social smoker  Substance Use Topics  . Alcohol use: Yes    Alcohol/week: 0.0 standard drinks    Comment: occasionally      Objective:   BP 116/64 (BP Location: Right Arm, Patient Position: Sitting, Cuff Size: Normal)   Pulse 76   Temp 98.7 F (37.1 C) (Oral)   Resp 16   Wt 133 lb (60.3 kg)   LMP  (LMP Unknown)   BMI 23.56 kg/m  Vitals:   09/10/18 0820  BP: 116/64  Pulse: 76  Resp: 16  Temp: 98.7 F (37.1 C)  TempSrc: Oral  Weight: 133 lb (60.3 kg)    Physical Exam  General appearance: alert, well developed, well nourished, cooperative and in no distress Head: Normocephalic, without obvious abnormality, atraumatic Respiratory: Respirations even and unlabored, normal respiratory rate Extremities: No gross deformities Skin: Skin color, texture, turgor normal. No rashes seen  Psych: Appropriate mood and affect. Neurologic: Mental status: Alert, oriented to person, place, and time, thought content appropriate.  Results for orders placed or performed in visit on 09/10/18  POCT urinalysis dipstick  Result Value Ref Range   Color, UA     Clarity, UA     Glucose, UA Negative Negative   Bilirubin, UA Negative    Ketones, UA Negative    Spec Grav, UA 1.015 1.010 - 1.025   Blood, UA Large    pH, UA 6.0 5.0 - 8.0   Protein, UA Negative Negative   Urobilinogen, UA 0.2 0.2 or 1.0 E.U./dL   Nitrite, UA Negative    Leukocytes, UA Negative Negative   Appearance     Odor         Assessment & Plan    1. Hypercholesteremia .   - Lipid panel  2. Pyelonephritis Cultures from 1/27 grew lactobacillus and switched back to amoxicillin yesterday. She feel somewhat better today, advised  to call if not completely better when finished with cipro - POCT urinalysis dipstick  3. Recurrent UTI She prefers not to be referred back to [facility] at this time. She saw Dr. Erlene Quan in 2017 and apparently discussed strategies for reducing UTI risk and mentioned trial of estrogen cream. Will cancer referral for now. Consider prophylactic antibiotic and/or estrogen creams.      Lelon Huh, MD  [facility]  Addendum: Patient reports she never started pravastatin as above due to potential liver side effects.

## 2018-09-11 LAB — LIPID PANEL
Chol/HDL Ratio: 2.5 ratio (ref 0.0–4.4)
Cholesterol, Total: 240 mg/dL — ABNORMAL HIGH (ref 100–199)
HDL: 96 mg/dL (ref >39)
LDL Calculated: 131 mg/dL — ABNORMAL HIGH (ref 0–99)
Triglycerides: 66 mg/dL (ref 0–149)
VLDL Cholesterol Cal: 13 mg/dL (ref 5–40)

## 2018-09-13 ENCOUNTER — Telehealth: Payer: Self-pay

## 2018-09-13 DIAGNOSIS — E7800 Pure hypercholesterolemia, unspecified: Secondary | ICD-10-CM

## 2018-09-13 NOTE — Telephone Encounter (Signed)
Patient was advised and stated she did not want the a year supply send into her pharmacy.

## 2018-09-13 NOTE — Telephone Encounter (Signed)
???   Need more info, is she stopping medication?? Why does she not want it refilled?

## 2018-09-13 NOTE — Telephone Encounter (Signed)
-----   Message from Birdie Sons, MD sent at 09/11/2018  3:45 PM EST ----- LDL cholesterol is still a little high at 131, but much better, was 171. Continue current dose of pravastatin and check lipids yearly. May send in years refill to pharmacy of her choice

## 2018-09-13 NOTE — Telephone Encounter (Signed)
Patient states that she has reported before she never started  the Pravastatin. She stated that she was asked to be prescribed a different medication for her cholesterol due to the statin causing harm to her liver.

## 2018-09-15 ENCOUNTER — Telehealth: Payer: Self-pay | Admitting: Family Medicine

## 2018-09-15 MED ORDER — AMOXICILLIN 500 MG PO CAPS: 500.0000 mg | ORAL_CAPSULE | Freq: Two times a day (BID) | ORAL | 0 refills | Status: DC

## 2018-09-15 NOTE — Telephone Encounter (Signed)
Pt letting Dr. Caryn Section know she is needing another round of antibiotic.  The infection has not cleared up.  Please call into:  [id number] - [address], [place] [phone] (Phone) [phone] (Fax)   Thanks, [employer]

## 2018-09-20 ENCOUNTER — Ambulatory Visit: Payer: BLUE CROSS/BLUE SHIELD | Admitting: Urology

## 2018-10-01 ENCOUNTER — Ambulatory Visit: Payer: BLUE CROSS/BLUE SHIELD | Admitting: Urology

## 2018-10-05 ENCOUNTER — Ambulatory Visit: Payer: BLUE CROSS/BLUE SHIELD | Admitting: Urology

## 2018-10-05 ENCOUNTER — Encounter: Payer: Self-pay | Admitting: Urology

## 2018-10-05 ENCOUNTER — Other Ambulatory Visit: Payer: Self-pay

## 2018-10-05 VITALS — BP 109/71 | HR 83 | Ht 63.0 in | Wt 130.1 lb

## 2018-10-05 DIAGNOSIS — R310 Gross hematuria: Secondary | ICD-10-CM

## 2018-10-05 LAB — URINALYSIS, COMPLETE
Bilirubin, UA: NEGATIVE
Glucose, UA: NEGATIVE
Ketones, UA: NEGATIVE
Leukocytes, UA: NEGATIVE
Nitrite, UA: NEGATIVE
Protein, UA: NEGATIVE
RBC, UA: NEGATIVE
Specific Gravity, UA: 1.02 (ref 1.005–1.030)
UUROB: 0.2 mg/dL (ref 0.2–1.0)
pH, UA: 6 (ref 5.0–7.5)

## 2018-10-05 LAB — MICROSCOPIC EXAMINATION: Bacteria, UA: NONE SEEN

## 2018-10-05 NOTE — Progress Notes (Signed)
10/05/2018 3:48 PM   Cheryl Cooke March 18, 1961 [id number]  Referring provider: Birdie Sons, [place] [address], [place] [address]  Chief Complaint  Patient presents with  . Hematuria    HPI: 58 year old female with a history of recurrent UTIs previously seen by Dr. Erlene Quan in 2017.  On 09/06/2018 she had an episode of gross hematuria and mild left low back and groin discomfort.  Dipstick urinalysis showed large blood and small leukocytes and was prescribed amoxicillin.  A urine culture was ordered and grew 25,000-50,000 colonies of lactobacillus.  She subsequently presented to the ED on 09/07/2018 complaining of severe left flank pain radiating to the left groin region and associated with hematuria.  Urinalysis in the ED showed >50 WBC and RBC but urine culture was negative.  A stone protocol CT of the abdomen pelvis was performed which showed no perinephric stranding, calculi or hydronephrosis.  Her pain had improved by the time her CT was performed and she had persistent dull pain for several weeks though no recurrent hematuria.  She was switched to Cipro in the ED   PMH: Past Medical History:  Diagnosis Date  . Absolute anemia 02/26/2015   During pregnancy.   . Anxiety   . Colon cancer screening   . Depression   . Dysplasia of cervix, low grade (CIN 1) 2005   OBSERVATION  . H/O: vasectomy    HUSBAND HAS HAD VASECTOMY  . Herpes progenitalis     Surgical History: Past Surgical History:  Procedure Laterality Date  . BREAST SURGERY Bilateral 1979   REDUCTION  . COLONOSCOPY WITH PROPOFOL N/A 08/26/2018   Procedure: COLONOSCOPY WITH PROPOFOL;  Surgeon: Lin Landsman, MD;  Location: [facility] ENDOSCOPY;  Service: Gastroenterology;  Laterality: N/A;  . FOOT SURGERY  1979 AND 2002   RIGHT 1979, LEFT 2002; Bunion and hammertoes  . REDUCTION MAMMAPLASTY Bilateral 1978    Home Medications:  Allergies as of 10/05/2018      Reactions   Clarithromycin  Nausea Only   Keflex [cephalexin] Nausea Only   Dizziness and stomach pain   Levofloxacin    Other reaction(s): Nausea   Shrimp [shellfish Allergy] Swelling   Sulfa Antibiotics Swelling      Medication List       Accurate as of October 05, 2018  3:48 PM. Always use your most recent med list.        amoxicillin 500 MG capsule Commonly known as:  AMOXIL Take 1 capsule (500 mg total) by mouth 2 (two) times daily.   aspirin EC 81 MG tablet Take 81 mg by mouth daily.   calcium-vitamin D 250-125 MG-UNIT tablet Commonly known as:  OSCAL WITH D Take 1 tablet by mouth daily.   dicyclomine 10 MG capsule Commonly known as:  BENTYL Take 1 capsule (10 mg total) by mouth 4 (four) times daily -  before meals and at bedtime for 30 doses.   EPINEPHrine 0.3 mg/0.3 mL Soaj injection Commonly known as:  EPI-PEN U UTD   fluticasone 50 MCG/ACT nasal spray Commonly known as:  FLONASE   leflunomide 20 MG tablet Commonly known as:  ARAVA Take 20 mg by mouth daily.   Melatonin 2.5 MG Chew Chew 1 mg by mouth Nightly.   OLUMIANT 2 MG Tabs Generic drug:  Baricitinib Take 1 mg by mouth daily.   ondansetron 4 MG disintegrating tablet Commonly known as:  ZOFRAN ODT Take 1 tablet (4 mg total) by mouth every 8 (eight) hours  as needed.   sertraline 25 MG tablet Commonly known as:  ZOLOFT Take 1 tablet (25 mg total) by mouth daily for 30 days.       Allergies:  Allergies  Allergen Reactions  . Clarithromycin Nausea Only  . Keflex [Cephalexin] Nausea Only    Dizziness and stomach pain  . Levofloxacin     Other reaction(s): Nausea  . Shrimp [Shellfish Allergy] Swelling  . Sulfa Antibiotics Swelling    Family History: Family History  Problem Relation Age of Onset  . Diabetes Mother   . Hypertension Mother   . Alzheimer's disease Mother   . Allergies Mother   . Stroke Mother   . Heart attack Mother   . Heart disease Father   . Gout Father   . Heart attack Father   .  Cancer Maternal Grandmother        UTERINE CANCER  . Stroke Maternal Grandfather   . Bladder Cancer Neg Hx   . Kidney cancer Neg Hx   . Prostate cancer Neg Hx     Social History:  reports that she quit smoking about 35 years ago. She quit after 3.00 years of use. She has never used smokeless tobacco. She reports current alcohol use. She reports that she does not use drugs.  ROS: UROLOGY Frequent Urination?: No Hard to postpone urination?: No Burning/pain with urination?: No Get up at night to urinate?: No Leakage of urine?: No Urine stream starts and stops?: No Trouble starting stream?: No Do you have to strain to urinate?: No Blood in urine?: No Urinary tract infection?: Yes Sexually transmitted disease?: No Injury to kidneys or bladder?: No Painful intercourse?: No Weak stream?: No Currently pregnant?: No Vaginal bleeding?: No Last menstrual period?: n  Gastrointestinal Nausea?: No Vomiting?: No Indigestion/heartburn?: No Diarrhea?: No Constipation?: Yes  Constitutional Fever: No Night sweats?: No Weight loss?: No Fatigue?: No  Skin Skin rash/lesions?: No Itching?: No  Eyes Blurred vision?: No Double vision?: No  Ears/Nose/Throat Sore throat?: No Sinus problems?: Yes  Hematologic/Lymphatic Swollen glands?: No Easy bruising?: No  Cardiovascular Leg swelling?: No Chest pain?: No  Respiratory Cough?: No Shortness of breath?: No  Endocrine Excessive thirst?: No  Musculoskeletal Back pain?: No Joint pain?: Yes  Neurological Headaches?: No Dizziness?: No  Psychologic Depression?: Yes Anxiety?: No  Physical Exam: BP 109/71   Pulse 83   Ht 5\' 3"  (1.6 m)   Wt 130 lb 1.6 oz (59 kg)   LMP  (LMP Unknown)   SpO2 97%   BMI 23.05 kg/m   Constitutional:  Alert and oriented, No acute distress. HEENT: [place] AT, moist mucus membranes.  Trachea midline, no masses. Cardiovascular: No clubbing, cyanosis, or edema. Respiratory: Normal respiratory  effort, no increased work of breathing. GI: Abdomen is soft, nontender, nondistended, no abdominal masses GU: No CVA tenderness Lymph: No cervical or inguinal lymphadenopathy. Skin: No rashes, bruises or suspicious lesions. Neurologic: Grossly intact, no focal deficits, moving all 4 extremities. Psychiatric: Normal mood and affect.  Laboratory Data:  Urinalysis Negative dipstick/microscopy  Pertinent Imaging: Images personally reviewed Results for orders placed during the hospital encounter of 09/07/18  CT Renal Stone Study   Narrative CLINICAL DATA:  Hematuria  EXAM: CT ABDOMEN AND PELVIS WITHOUT CONTRAST  TECHNIQUE: Multidetector CT imaging of the abdomen and pelvis was performed following the standard protocol without IV contrast.  COMPARISON:  None.  FINDINGS: LOWER CHEST: There is no basilar pleural or apical pericardial effusion.  HEPATOBILIARY: The hepatic contours and density are  normal. There is no intra- or extrahepatic biliary dilatation. The gallbladder is normal.  PANCREAS: The pancreatic parenchymal contours are normal and there is no ductal dilatation. There is no peripancreatic fluid collection.  SPLEEN: Normal.  ADRENALS/URINARY TRACT:  --Adrenal glands: Normal.  --Right kidney/ureter: No hydronephrosis, nephroureterolithiasis, perinephric stranding or solid renal mass.  --Left kidney/ureter: No hydronephrosis, nephroureterolithiasis, perinephric stranding or solid renal mass.  --Urinary bladder: Normal for degree of distention  STOMACH/BOWEL:  --Stomach/Duodenum: There is no hiatal hernia or other gastric abnormality. The duodenal course and caliber are normal.  --Small bowel: No dilatation or inflammation.  --Colon: No focal abnormality.  --Appendix: Normal.  VASCULAR/LYMPHATIC: There is aortic atherosclerosis without hemodynamically significant stenosis. No abdominal or pelvic lymphadenopathy.  REPRODUCTIVE: Normal uterus and  ovaries.  MUSCULOSKELETAL. No bony spinal canal stenosis or focal osseous abnormality.  OTHER: None.  IMPRESSION: No acute abdominal or pelvic abnormality.  Aortic atherosclerosis (ICD10-I70.0).   Electronically Signed   By: Ulyses Jarred M.D.   On: 09/07/2018 01:03     Assessment & Plan:   58 year old female with a recent episode of hematuria and severe left flank/left groin pain.  Her urine culture grew a low count of lactobacillus which is not a pathologic organism and her clinical presentation/labs is not consistent with infection.  I did discuss the possibility of renal colic due to a small calculus which she had passed by the time her CT was performed.  Since I do not have a clear picture of the etiology of her symptoms have recommended further evaluation with a CT urogram and cystoscopy.    Abbie Sons, [place] [address], [place], [place] [address] [phone]

## 2018-10-06 ENCOUNTER — Encounter: Payer: Self-pay | Admitting: Urology

## 2018-10-20 ENCOUNTER — Ambulatory Visit
Admission: RE | Admit: 2018-10-20 | Discharge: 2018-10-20 | Disposition: A | Discharge status: Home / Self Care | Payer: BLUE CROSS/BLUE SHIELD | Source: Ambulatory Visit | Attending: Urology | Admitting: Urology

## 2018-10-20 ENCOUNTER — Other Ambulatory Visit: Payer: Self-pay

## 2018-10-20 DIAGNOSIS — R310 Gross hematuria: Secondary | ICD-10-CM | POA: Diagnosis present

## 2018-10-20 HISTORY — DX: Systemic involvement of connective tissue, unspecified: M35.9

## 2018-10-20 LAB — POCT I-STAT CREATININE: Creatinine, Ser: 0.7 mg/dL (ref 0.44–1.00)

## 2018-10-20 MED ORDER — IOHEXOL 300 MG/ML  SOLN
100.0000 mL | Freq: Once | INTRAMUSCULAR | Status: AC | PRN
  Administered 2018-10-20: 100 mL via INTRAVENOUS

## 2018-10-22 ENCOUNTER — Telehealth: Payer: Self-pay

## 2018-10-22 NOTE — Telephone Encounter (Signed)
Called pt no answer. LM for pt informing her of the information below.  

## 2018-10-22 NOTE — Telephone Encounter (Signed)
-----   Message from Abbie Sons, MD sent at 10/22/2018  7:37 AM EDT ----- CT showed no significant abnormalities.  Keep cystoscopy appointment 3/17

## 2018-10-26 ENCOUNTER — Ambulatory Visit: Payer: BLUE CROSS/BLUE SHIELD | Admitting: Urology

## 2018-10-26 ENCOUNTER — Other Ambulatory Visit: Payer: Self-pay

## 2018-10-26 ENCOUNTER — Encounter: Payer: Self-pay | Admitting: Urology

## 2018-10-26 VITALS — BP 113/68 | HR 77 | Ht 62.0 in | Wt 130.0 lb

## 2018-10-26 DIAGNOSIS — R310 Gross hematuria: Secondary | ICD-10-CM

## 2018-10-26 DIAGNOSIS — Z87898 Personal history of other specified conditions: Secondary | ICD-10-CM

## 2018-10-26 LAB — URINALYSIS, COMPLETE
Bilirubin, UA: NEGATIVE
Glucose, UA: NEGATIVE
Ketones, UA: NEGATIVE
Leukocytes, UA: NEGATIVE
Nitrite, UA: NEGATIVE
PH UA: 6 (ref 5.0–7.5)
Protein, UA: NEGATIVE
RBC UA: NEGATIVE
SPEC GRAV UA: 1.02 (ref 1.005–1.030)
Urobilinogen, Ur: 0.2 mg/dL (ref 0.2–1.0)

## 2018-10-26 NOTE — Progress Notes (Signed)
   10/26/18  CC:  Chief Complaint  Patient presents with  . Cysto    HPI: Refer to my previous note of 10/05/2018.  She denies recurrent pain or hematuria.  CT urogram shows no significant upper tract abnormalities.  Blood pressure 113/68, pulse 77, height 5\' 2"  (1.575 m), weight 130 lb (59 kg). NED. A&Ox3.   No respiratory distress   Abd soft, NT, ND Atrophic external genitalia with patent urethral meatus  Cystoscopy Procedure Note  Patient identification was confirmed, informed consent was obtained, and patient was prepped using Betadine solution.  Lidocaine jelly was administered per urethral meatus.    Procedure: - Flexible cystoscope introduced, without any difficulty.   - Thorough search of the bladder revealed:    normal urethral meatus    normal urothelium    no stones    no ulcers     no tumors    no urethral polyps    no trabeculation  - Ureteral orifices were normal in position and appearance.  Post-Procedure: - Patient tolerated the procedure well  Assessment/ Plan: Previous episode may have been secondary to a small ureteral calculus.  She was given stone prevention literature.  Recommend a follow-up visit with [facility] in 6 months.  Instructed to call earlier for recurrent symptoms.    Return in about 6 months (around 04/28/2019) for Recheck.  Abbie Sons, MD

## 2018-10-27 ENCOUNTER — Other Ambulatory Visit: Payer: BLUE CROSS/BLUE SHIELD | Admitting: Urology

## 2018-12-01 ENCOUNTER — Ambulatory Visit (INDEPENDENT_AMBULATORY_CARE_PROVIDER_SITE_OTHER): Payer: BLUE CROSS/BLUE SHIELD | Admitting: Gastroenterology

## 2018-12-01 DIAGNOSIS — R1319 Other dysphagia: Secondary | ICD-10-CM

## 2018-12-01 DIAGNOSIS — R1310 Dysphagia, unspecified: Secondary | ICD-10-CM | POA: Diagnosis not present

## 2018-12-01 DIAGNOSIS — K580 Irritable bowel syndrome with diarrhea: Secondary | ICD-10-CM | POA: Diagnosis not present

## 2018-12-01 NOTE — Progress Notes (Signed)
Sherri Sear, MD [address]  [place]  [place], [place] [address]  Main: [phone]  Fax: [phone]    Gastroenterology follow-up video Visit  Referring Provider:     Birdie Sons, MD Primary Care Physician:  Birdie Sons, MD Primary Gastroenterologist:  Dr. Cephas Darby Reason for Consultation:     IBS        HPI:   Cheryl Cooke is a 58 y.o. female referred by Dr. Birdie Sons, MD  for consultation & management of IBS  Virtual Visit Video Note  I connected with Cheryl Cooke on 12/01/18 at  9:00 AM EDT by video and verified that I am speaking with the correct person using two identifiers.   I discussed the limitations, risks, security and privacy concerns of performing an evaluation and management service by video and the availability of in person appointments. I also discussed with the patient that there may be a patient responsible charge related to this service. The patient expressed understanding and agreed to proceed.  Location of the Patient: Home  Location of the provider: Home office   History of Present Illness: Cheryl Cooke has done really well with regards to her GI symptoms since she started counseling for PTSD.  She describes it as " my life turnaround".  She did take Zoloft 25 mg at bedtime for a month.  She totally feels like a different person.  She continues to see the counselor for PTSD.  And, she has been reading a lot of books about pain management, adjustment in PTSD.  She continues to try to avoid food triggers. She had an episode of abdominal pain and hematuria in late January 2020 for which she went to ER, had mild drop in hemoglobin, followed by urology and was suspected to be a kidney stone.  She is no longer describing hematuria.  She wanted to discuss about her EGD findings from 2010 at outside hospital.  She read the note to me which described as LA grade B esophagitis, mucosal rings, erythematous stomach.  She  does not know if biopsies were performed.   NSAIDs: None  Antiplts/Anticoagulants/Anti thrombotics: None  GI Procedures:  Sigmoidoscopy 1990s  Colonoscopy 08/26/2018 - The examined portion of the ileum was normal. - One 5 mm polyp in the cecum, removed with a cold snare. Resected and retrieved. - Normal mucosa in the entire examined colon. Biopsied. - The distal rectum and anal verge are normal on retroflexion view.  DIAGNOSIS:  A. COLON POLYP, CECUM; COLD SNARE:  - COLONIC MUCOSA WITH PROMINENT LYMPHOID AGGREGATE.  - NEGATIVE FOR MICROSCOPIC COLITIS, DYSPLASIA, AND MALIGNANCY  (ADDITIONAL DEEPER SECTIONS REVIEWED).   B. COLON; RANDOM COLD BIOPSY:  - COLONIC MUCOSA WITH INTACT CRYPT ARCHITECTURE.  - NEGATIVE FOR ACTIVE INFLAMMATION, MICROSCOPIC COLITIS, AND DYSPLASIA.   Past Medical History:  Diagnosis Date  . Absolute anemia 02/26/2015   During pregnancy.   . Anxiety   . Collagen vascular disease (HCC)    Hx Rheumatoid Arthritis  . Colon cancer screening   . Depression   . Dysplasia of cervix, low grade (CIN 1) 2005   OBSERVATION  . H/O: vasectomy    HUSBAND HAS HAD VASECTOMY  . Herpes progenitalis     Past Surgical History:  Procedure Laterality Date  . BREAST SURGERY Bilateral 1979   REDUCTION  . COLONOSCOPY WITH PROPOFOL N/A 08/26/2018   Procedure: COLONOSCOPY WITH PROPOFOL;  Surgeon: Lin Landsman, MD;  Location: [facility];  Service: Gastroenterology;  Laterality: N/A;  . FOOT SURGERY  1979 AND 2002   RIGHT 1979, LEFT 2002; Bunion and hammertoes  . REDUCTION MAMMAPLASTY Bilateral 1978    Current Outpatient Medications:  .  aspirin EC 81 MG tablet, Take 81 mg by mouth daily., Disp: , Rfl:  .  Baricitinib (OLUMIANT) 2 MG TABS, Take 1 mg by mouth daily., Disp: , Rfl:  .  calcium-vitamin D (OSCAL WITH D) 250-125 MG-UNIT per tablet, Take 1 tablet by mouth daily. , Disp: , Rfl:  .  EPINEPHrine 0.3 mg/0.3 mL IJ SOAJ injection, U UTD, Disp: , Rfl:  .   fluticasone (FLONASE) 50 MCG/ACT nasal spray, , Disp: , Rfl:  .  leflunomide (ARAVA) 20 MG tablet, Take 20 mg by mouth daily. , Disp: , Rfl:     Family History  Problem Relation Age of Onset  . Diabetes Mother   . Hypertension Mother   . Alzheimer's disease Mother   . Allergies Mother   . Stroke Mother   . Heart attack Mother   . Heart disease Father   . Gout Father   . Heart attack Father   . Cancer Maternal Grandmother        UTERINE CANCER  . Stroke Maternal Grandfather   . Bladder Cancer Neg Hx   . Kidney cancer Neg Hx   . Prostate cancer Neg Hx      Social History   Tobacco Use  . Smoking status: Former Smoker    Years: 3.00    Last attempt to quit: 08/11/1983    Years since quitting: 35.3  . Smokeless tobacco: Never Used  . Tobacco comment: was a social smoker  Substance Use Topics  . Alcohol use: Yes    Alcohol/week: 0.0 standard drinks    Comment: occasionally  . Drug use: No    Allergies as of 12/01/2018 - Review Complete 12/01/2018  Allergen Reaction Noted  . Clarithromycin Nausea Only 02/26/2011  . Keflex [cephalexin] Nausea Only 05/19/2016  . Levofloxacin  02/26/2015  . Shrimp [shellfish allergy] Swelling 02/26/2011  . Sulfa antibiotics Swelling 02/26/2011     Imaging Studies: Reviewed  Assessment and Plan:   Cheryl Cooke is a 58 y.o. female with history of rheumatoid arthritis, Sjogren's, chronic history of abdominal pain, cramps associated with nonbloody, loose stools.  She does not have any constitutional symptoms. She has been going through significant stress in last 22 years, history of PTSD and currently undergoing cognitive behavioral therapy.  Colonoscopy was unremarkable including biopsies.  Celiac serologies and H. pylori breath test was normal.  Her history is highly consistent with IBS diarrhea in the setting of PTSD.  She has made significant progress both psychologically and IBS symptoms.  Given her EGD findings of esophagitis and  rings from 2010, recommend repeat EGD with esophageal biopsies and also evaluate for underlying Barrett's.  She does report intermittent episodes of food stuck especially when she tries to eat bread or hard foods  Recommend EGD with esophageal and gastric biopsies   Follow Up Instructions:   I discussed the assessment and treatment plan with the patient. The patient was provided an opportunity to ask questions and all were answered. The patient agreed with the plan and demonstrated an understanding of the instructions.   The patient was advised to call back or seek an in-person evaluation if the symptoms worsen or if the condition fails to improve as anticipated.  I provided 25 minutes of face-to-face time during this encounter.  Follow up based on the EGD results   Cephas Darby, MD

## 2018-12-06 ENCOUNTER — Other Ambulatory Visit: Payer: Self-pay

## 2018-12-06 DIAGNOSIS — Z8719 Personal history of other diseases of the digestive system: Secondary | ICD-10-CM

## 2018-12-06 DIAGNOSIS — R1310 Dysphagia, unspecified: Secondary | ICD-10-CM

## 2018-12-14 ENCOUNTER — Ambulatory Visit: Payer: BLUE CROSS/BLUE SHIELD | Admitting: Gastroenterology

## 2018-12-16 ENCOUNTER — Ambulatory Visit: Payer: BLUE CROSS/BLUE SHIELD | Admitting: Gastroenterology

## 2019-01-24 ENCOUNTER — Encounter
Admission: RE | Admit: 2019-01-24 | Discharge: 2019-01-24 | Disposition: A | Discharge status: Home / Self Care | Payer: BC Managed Care – PPO | Source: Ambulatory Visit | Attending: Gastroenterology | Admitting: Gastroenterology

## 2019-01-24 ENCOUNTER — Other Ambulatory Visit: Payer: Self-pay

## 2019-01-24 DIAGNOSIS — Z01812 Encounter for preprocedural laboratory examination: Secondary | ICD-10-CM | POA: Diagnosis not present

## 2019-01-24 DIAGNOSIS — Z1159 Encounter for screening for other viral diseases: Secondary | ICD-10-CM | POA: Diagnosis not present

## 2019-01-25 ENCOUNTER — Telehealth: Payer: Self-pay | Admitting: Gastroenterology

## 2019-01-25 LAB — NOVEL CORONAVIRUS, NAA (HOSP ORDER, SEND-OUT TO REF LAB; TAT 18-24 HRS): SARS-CoV-2, NAA: NOT DETECTED

## 2019-01-25 NOTE — Telephone Encounter (Signed)
Patient called in & l/m stating she had a procedure on Thursday & has questions.

## 2019-01-26 ENCOUNTER — Encounter: Payer: Self-pay | Admitting: *Deleted

## 2019-01-26 NOTE — Telephone Encounter (Signed)
Pt left vm she states she has a procedure tomorrow and has questions please call pt

## 2019-01-26 NOTE — Telephone Encounter (Signed)
Pt had a question as to whether or not Dr. Marius Ditch will be looking in her stomach during the EGD. I advised pt that should would.

## 2019-01-26 NOTE — Telephone Encounter (Signed)
LVM for pt to return my call.

## 2019-01-27 ENCOUNTER — Ambulatory Visit: Payer: BC Managed Care – PPO | Admitting: Certified Registered"

## 2019-01-27 ENCOUNTER — Encounter
Admission: RE | Disposition: A | Discharge status: Home / Self Care | Payer: Self-pay | Source: Home / Self Care | Attending: Gastroenterology

## 2019-01-27 ENCOUNTER — Ambulatory Visit
Admission: RE | Admit: 2019-01-27 | Discharge: 2019-01-27 | Disposition: A | Discharge status: Home / Self Care | Payer: BC Managed Care – PPO | Attending: Gastroenterology | Admitting: Gastroenterology

## 2019-01-27 ENCOUNTER — Encounter: Payer: Self-pay | Admitting: *Deleted

## 2019-01-27 DIAGNOSIS — R1013 Epigastric pain: Secondary | ICD-10-CM | POA: Insufficient documentation

## 2019-01-27 DIAGNOSIS — Z7982 Long term (current) use of aspirin: Secondary | ICD-10-CM | POA: Insufficient documentation

## 2019-01-27 DIAGNOSIS — Z8719 Personal history of other diseases of the digestive system: Secondary | ICD-10-CM

## 2019-01-27 DIAGNOSIS — R1314 Dysphagia, pharyngoesophageal phase: Secondary | ICD-10-CM | POA: Diagnosis not present

## 2019-01-27 DIAGNOSIS — M069 Rheumatoid arthritis, unspecified: Secondary | ICD-10-CM | POA: Diagnosis not present

## 2019-01-27 DIAGNOSIS — K259 Gastric ulcer, unspecified as acute or chronic, without hemorrhage or perforation: Secondary | ICD-10-CM | POA: Insufficient documentation

## 2019-01-27 DIAGNOSIS — Z87891 Personal history of nicotine dependence: Secondary | ICD-10-CM | POA: Insufficient documentation

## 2019-01-27 DIAGNOSIS — K257 Chronic gastric ulcer without hemorrhage or perforation: Secondary | ICD-10-CM

## 2019-01-27 DIAGNOSIS — R1310 Dysphagia, unspecified: Secondary | ICD-10-CM

## 2019-01-27 HISTORY — PX: ESOPHAGOGASTRODUODENOSCOPY (EGD) WITH PROPOFOL: SHX5813

## 2019-01-27 SURGERY — ESOPHAGOGASTRODUODENOSCOPY (EGD) WITH PROPOFOL
Anesthesia: General

## 2019-01-27 MED ORDER — PROPOFOL 10 MG/ML IV BOLUS
INTRAVENOUS | Status: DC | PRN
  Administered 2019-01-27: 20 mg via INTRAVENOUS
  Administered 2019-01-27: 50 mg via INTRAVENOUS
  Administered 2019-01-27: 20 mg via INTRAVENOUS
  Administered 2019-01-27: 50 mg via INTRAVENOUS

## 2019-01-27 MED ORDER — SODIUM CHLORIDE 0.9 % IV SOLN
INTRAVENOUS | Status: DC
  Administered 2019-01-27: 09:00:00 via INTRAVENOUS

## 2019-01-27 MED ORDER — OMEPRAZOLE 40 MG PO CPDR: 40.0000 mg | DELAYED_RELEASE_CAPSULE | Freq: Every day | ORAL | 2 refills | Status: DC

## 2019-01-27 MED ORDER — LIDOCAINE HCL (CARDIAC) PF 100 MG/5ML IV SOSY
PREFILLED_SYRINGE | INTRAVENOUS | Status: DC | PRN
  Administered 2019-01-27: 100 mg via INTRATRACHEAL

## 2019-01-27 NOTE — Transfer of Care (Signed)
Immediate Anesthesia Transfer of Care Note  [name]  Procedure(s) Performed: ESOPHAGOGASTRODUODENOSCOPY (EGD) WITH PROPOFOL (N/A )  Patient Location: Endoscopy Unit  Anesthesia Type:General  Level of Consciousness: awake and patient cooperative  Airway & Oxygen Therapy: Patient Spontanous Breathing and Patient connected to face mask oxygen  Post-op Assessment: Report given to RN and Post -op Vital signs reviewed and stable  Post vital signs: Reviewed and stable  Last Vitals:  Vitals Value Taken Time  BP 101/62 01/27/19 1011  Temp 36.4 C 01/27/19 1010  Pulse 63 01/27/19 1013  Resp 18 01/27/19 1013  SpO2 100 % 01/27/19 1013  Vitals shown include unvalidated device data.  Last Pain:  Vitals:   01/27/19 1010  TempSrc: Tympanic  PainSc: 0-No pain         Complications: No apparent anesthesia complications

## 2019-01-27 NOTE — H&P (Signed)
Cephas Darby, MD [address]  [place]  [place], [place] [address]  Main: [phone]  Fax: [phone] Pager: [phone]  Primary Care Physician:  Birdie Sons, MD Primary Gastroenterologist:  Dr. Cephas Darby  Pre-Procedure History & Physical: HPI:  Cheryl Cooke is a 58 y.o. female is here for an endoscopy.   Past Medical History:  Diagnosis Date  . Absolute anemia 02/26/2015   During pregnancy.   . Anxiety   . Collagen vascular disease (HCC)    Hx Rheumatoid Arthritis  . Colon cancer screening   . Depression   . Dysplasia of cervix, low grade (CIN 1) 2005   OBSERVATION  . H/O: vasectomy    HUSBAND HAS HAD VASECTOMY  . Herpes progenitalis     Past Surgical History:  Procedure Laterality Date  . BREAST SURGERY Bilateral 1979   REDUCTION  . COLONOSCOPY WITH PROPOFOL N/A 08/26/2018   Procedure: COLONOSCOPY WITH PROPOFOL;  Surgeon: Lin Landsman, MD;  Location: [facility] ENDOSCOPY;  Service: Gastroenterology;  Laterality: N/A;  . FOOT SURGERY  1979 AND 2002   RIGHT 1979, LEFT 2002; Bunion and hammertoes  . REDUCTION MAMMAPLASTY Bilateral 1978  . VASECTOMY      Prior to Admission medications   Medication Sig Start Date End Date Taking? Authorizing Provider  aspirin EC 81 MG tablet Take 81 mg by mouth daily.    [provider]  Baricitinib (OLUMIANT) 2 MG TABS Take 1 mg by mouth daily.    [provider]  calcium-vitamin D (OSCAL WITH D) 250-125 MG-UNIT per tablet Take 1 tablet by mouth daily.     [provider]  EPINEPHrine 0.3 mg/0.3 mL IJ SOAJ injection U UTD 09/01/18   [provider]  fluticasone Asencion Islam) 50 MCG/ACT nasal spray  08/05/18   [provider]  leflunomide (ARAVA) 20 MG tablet Take 20 mg by mouth daily.     [provider]    Allergies as of 12/07/2018 - Review Complete 12/01/2018  Allergen Reaction Noted  . Clarithromycin Nausea Only 02/26/2011  . Keflex [cephalexin]  Nausea Only 05/19/2016  . Levofloxacin  02/26/2015  . Shrimp [shellfish allergy] Swelling 02/26/2011  . Sulfa antibiotics Swelling 02/26/2011    Family History  Problem Relation Age of Onset  . Diabetes Mother   . Hypertension Mother   . Alzheimer's disease Mother   . Allergies Mother   . Stroke Mother   . Heart attack Mother   . Heart disease Father   . Gout Father   . Heart attack Father   . Cancer Maternal Grandmother        UTERINE CANCER  . Stroke Maternal Grandfather   . Bladder Cancer Neg Hx   . Kidney cancer Neg Hx   . Prostate cancer Neg Hx     Social History   Socioeconomic History  . Marital status: Married    Spouse name: Not on file  . Number of children: 1  . Years of education: H/S  . Highest education level: Not on file  Occupational History  . Occupation: Geophysicist/field seismologist  Social Needs  . Financial resource strain: Not on file  . Food insecurity    Worry: Not on file    Inability: Not on file  . Transportation needs    Medical: Not on file    Non-medical: Not on file  Tobacco Use  . Smoking status: Former Smoker    Years: 3.00    Quit date: 08/11/1983  Years since quitting: 35.4  . Smokeless tobacco: Never Used  . Tobacco comment: was a social smoker  Substance and Sexual Activity  . Alcohol use: Yes    Alcohol/week: 0.0 standard drinks    Comment: occasionally  . Drug use: No  . Sexual activity: Yes    Birth control/protection: Post-menopausal  Lifestyle  . Physical activity    Days per week: Not on file    Minutes per session: Not on file  . Stress: Not on file  Relationships  . Social Herbalist on phone: Not on file    Gets together: Not on file    Attends religious service: Not on file    Active member of club or organization: Not on file    Attends meetings of clubs or organizations: Not on file    Relationship status: Not on file  . Intimate partner violence    Fear of current or ex partner: Not on file     Emotionally abused: Not on file    Physically abused: Not on file    Forced sexual activity: Not on file  Other Topics Concern  . Not on file  Social History Narrative  . Not on file    Review of Systems: See HPI, otherwise negative ROS  Physical Exam: Pulse 68   Temp 97.7 F (36.5 C) (Tympanic)   Resp 16   Ht 5\' 3"  (1.6 m)   Wt 59 kg   LMP  (LMP Unknown)   SpO2 99%   BMI 23.03 kg/m  General:   Alert,  pleasant and cooperative in NAD Head:  Normocephalic and atraumatic. Neck:  Supple; no masses or thyromegaly. Lungs:  Clear throughout to auscultation.    Heart:  Regular rate and rhythm. Abdomen:  Soft, nontender and nondistended. Normal bowel sounds, without guarding, and without rebound.   Neurologic:  Alert and  oriented x4;  grossly normal neurologically.  Impression/Plan: Cheryl Cooke is here for an endoscopy to be performed for globus sensation, evaluate for EoE  Risks, benefits, limitations, and alternatives regarding  endoscopy have been reviewed with the patient.  Questions have been answered.  All parties agreeable.   Sherri Sear, MD  01/27/2019, 9:07 AM

## 2019-01-27 NOTE — Anesthesia Post-op Follow-up Note (Signed)
Anesthesia QCDR form completed.        

## 2019-01-27 NOTE — Anesthesia Preprocedure Evaluation (Signed)
Anesthesia Evaluation  Patient identified by MRN, date of birth, ID band Patient awake    Reviewed: Allergy & Precautions, H&P , NPO status , Patient's Chart, lab work & pertinent test results  Airway Mallampati: I  TM Distance: >3 FB     Dental  (+) Teeth Intact   Pulmonary former smoker,           Cardiovascular negative cardio ROS       Neuro/Psych PSYCHIATRIC DISORDERS Anxiety Depression negative neurological ROS     GI/Hepatic Neg liver ROS, GERD  Controlled,  Endo/Other  negative endocrine ROS  Renal/GU negative Renal ROS  negative genitourinary   Musculoskeletal   Abdominal   Peds  Hematology  (+) Blood dyscrasia, anemia ,   Anesthesia Other Findings Past Medical History: 02/26/2015: Absolute anemia     Comment:  During pregnancy.  No date: Anxiety No date: Depression 2005: Dysplasia of cervix, low grade (CIN 1)     Comment:  OBSERVATION No date: H/O: vasectomy     Comment:  HUSBAND HAS HAD VASECTOMY No date: Herpes progenitalis  Past Surgical History: 1979: BREAST SURGERY; Bilateral     Comment:  REDUCTION 1979 AND 2002: FOOT SURGERY     Comment:  RIGHT 1979, LEFT 2002; Bunion and hammertoes 1978: REDUCTION MAMMAPLASTY; Bilateral  BMI    Body Mass Index:  23.03 kg/m      Reproductive/Obstetrics negative OB ROS                             Anesthesia Physical  Anesthesia Plan  ASA: II  Anesthesia Plan: General   Post-op Pain Management:    Induction:   PONV Risk Score and Plan: Propofol infusion and TIVA  Airway Management Planned: Nasal Cannula  Additional Equipment:   Intra-op Plan:   Post-operative Plan:   Informed Consent: I have reviewed the patients History and Physical, chart, labs and discussed the procedure including the risks, benefits and alternatives for the proposed anesthesia with the patient or authorized representative who has indicated  his/her understanding and acceptance.     Dental Advisory Given  Plan Discussed with: Anesthesiologist, CRNA and Surgeon  Anesthesia Plan Comments:         Anesthesia Quick Evaluation

## 2019-01-27 NOTE — Op Note (Signed)
[facility] Gastroenterology Patient Name: Cheryl Cooke Procedure Date: 01/27/2019 9:44 AM MRN: [id number] Account #: [id number] Date of Birth: 23-May-1961 Admit Type: Outpatient Age: 58 Room: [facility] ENDO ROOM 4 Gender: Female Note Status: Finalized Procedure:            Upper GI endoscopy Indications:          Epigastric abdominal pain, Esophageal dysphagia Providers:            Lin Landsman MD, MD Medicines:            Monitored Anesthesia Care Complications:        No immediate complications. Estimated blood loss: None. Procedure:            Pre-Anesthesia Assessment:                       - Prior to the procedure, a History and Physical was                        performed, and patient medications and allergies were                        reviewed. The patient is competent. The risks and                        benefits of the procedure and the sedation options and                        risks were discussed with the patient. All questions                        were answered and informed consent was obtained.                        Patient identification and proposed procedure were                        verified by the physician, the nurse, the                        anesthesiologist, the anesthetist and the technician in                        the pre-procedure area in the procedure room in the                        endoscopy suite. Mental Status Examination: alert and                        oriented. Airway Examination: normal oropharyngeal                        airway and neck mobility. Respiratory Examination:                        clear to auscultation. CV Examination: normal.                        Prophylactic Antibiotics: The patient does not require  prophylactic antibiotics. Prior Anticoagulants: The                        patient has taken no previous anticoagulant or                        antiplatelet agents. ASA  Grade Assessment: II - A                        patient with mild systemic disease. After reviewing the                        risks and benefits, the patient was deemed in                        satisfactory condition to undergo the procedure. The                        anesthesia plan was to use monitored anesthesia care                        (MAC). Immediately prior to administration of                        medications, the patient was re-assessed for adequacy                        to receive sedatives. The heart rate, respiratory rate,                        oxygen saturations, blood pressure, adequacy of                        pulmonary ventilation, and response to care were                        monitored throughout the procedure. The physical status                        of the patient was re-assessed after the procedure.                       After obtaining informed consent, the endoscope was                        passed under direct vision. Throughout the procedure,                        the patient's blood pressure, pulse, and oxygen                        saturations were monitored continuously. The Endoscope                        was introduced through the mouth, and advanced to the                        second part of duodenum. The upper GI endoscopy was  accomplished without difficulty. The patient tolerated                        the procedure well. Findings:      The duodenal bulb and second portion of the duodenum were normal.      Few non-bleeding superficial gastric ulcers with a clean ulcer base       (Forrest Class III) were found in the gastric antrum and in the       prepyloric region of the stomach. The largest lesion was 5 mm in largest       dimension.      The gastric fundus, gastric body and lesser curvature of the stomach       were normal. Biopsies were taken with a cold forceps for Helicobacter       pylori testing.      The  cardia and gastric fundus were normal on retroflexion.      Esophagogastric landmarks were identified: the gastroesophageal junction       was found at 35 cm from the incisors.      The gastroesophageal junction and examined esophagus were normal.       Biopsies were obtained from the proximal and distal esophagus with cold       forceps for histology of suspected eosinophilic esophagitis. Impression:           - Normal duodenal bulb and second portion of the                        duodenum.                       - Non-bleeding gastric ulcers with a clean ulcer base                        (Forrest Class III).                       - Normal gastric fundus, gastric body and lesser                        curvature of the stomach. Biopsied.                       - Esophagogastric landmarks identified.                       - Normal gastroesophageal junction and esophagus.                        Biopsied. Recommendation:       - Discharge patient to home (with escort).                       - Resume previous diet today.                       - Continue present medications.                       - Await pathology results.                       - Use a proton pump inhibitor PO BID for 1 month. Procedure Code(s):    ---  Professional ---                       [phone], Esophagogastroduodenoscopy, flexible, transoral;                        with biopsy, single or multiple Diagnosis Code(s):    --- Professional ---                       K25.9, Gastric ulcer, unspecified as acute or chronic,                        without hemorrhage or perforation                       R10.13, Epigastric pain                       R13.14, Dysphagia, pharyngoesophageal phase CPT copyright 2019 American Medical Association. All rights reserved. The codes documented in this report are preliminary and upon coder review may  be revised to meet current compliance requirements. Dr. Ulyess Mort Lin Landsman MD,  MD 01/27/2019 10:10:08 AM This report has been signed electronically. Number of Addenda: 0 Note Initiated On: 01/27/2019 9:44 AM Estimated Blood Loss: Estimated blood loss: none.      [facility]

## 2019-01-28 ENCOUNTER — Encounter: Payer: Self-pay | Admitting: Gastroenterology

## 2019-01-28 LAB — SURGICAL PATHOLOGY

## 2019-01-28 NOTE — Anesthesia Postprocedure Evaluation (Signed)
Anesthesia Post Note  [name]  Procedure(s) Performed: ESOPHAGOGASTRODUODENOSCOPY (EGD) WITH PROPOFOL (N/A )  Patient location during evaluation: Endoscopy Anesthesia Type: General Level of consciousness: awake and alert and oriented Pain management: pain level controlled Vital Signs Assessment: post-procedure vital signs reviewed and stable Respiratory status: spontaneous breathing Cardiovascular status: blood pressure returned to baseline Anesthetic complications: no     Last Vitals:  Vitals:   01/27/19 1020 01/27/19 1040  BP: 129/82 (!) 144/79  Pulse:    Resp:    Temp:    SpO2:      Last Pain:  Vitals:   01/27/19 1040  TempSrc:   PainSc: 0-No pain                 Nakiah Osgood

## 2019-02-02 ENCOUNTER — Telehealth: Payer: Self-pay | Admitting: Gastroenterology

## 2019-02-02 NOTE — Telephone Encounter (Signed)
Pt left vm regarding her biopsy results

## 2019-02-03 NOTE — Telephone Encounter (Signed)
Pt has been notified of results and verbalized understanding  

## 2019-02-22 ENCOUNTER — Telehealth: Payer: Self-pay | Admitting: Gastroenterology

## 2019-02-22 DIAGNOSIS — K279 Peptic ulcer, site unspecified, unspecified as acute or chronic, without hemorrhage or perforation: Secondary | ICD-10-CM

## 2019-02-22 NOTE — Telephone Encounter (Signed)
Pt states she has a new insurance  that will not cover her rx for the ulcers she needs new  rx called in please call pt

## 2019-03-01 MED ORDER — PANTOPRAZOLE SODIUM 40 MG PO TBEC: 40.0000 mg | DELAYED_RELEASE_TABLET | Freq: Two times a day (BID) | ORAL | 0 refills | Status: DC

## 2019-03-01 NOTE — Telephone Encounter (Signed)
Pt has been notified and verbalized understanding. 

## 2019-03-01 NOTE — Telephone Encounter (Signed)
I have sent in prescription for Protonix 40mg  BID Please call patient and let her know.  This is in place of omeprazole since her new insurance no longer can accommodate  [employer]

## 2019-03-01 NOTE — Telephone Encounter (Signed)
Pt is calling again she states she has left several message regarding needing a new rx called in for Omeprazole because her new insurance which is [employer] will not cover it she would like a call please

## 2019-03-07 ENCOUNTER — Telehealth: Payer: Self-pay | Admitting: Gastroenterology

## 2019-03-07 NOTE — Telephone Encounter (Signed)
Patient left message & I  Returned her call she needs a medication that will be  Covered by her [employer]. She has taken 2 different medication & does not know the names the bottle are at home. I have ask her to call back with this information so I can take a proper message for the nurse. She was in agreeance

## 2019-03-08 NOTE — Telephone Encounter (Signed)
Pt is calling back with names of rx Omeprazole 40 mg full 30 days  and the other rx Generic of Protonix  Please call pt

## 2019-03-11 NOTE — Telephone Encounter (Signed)
Pt left vm she states she missed a call please call pt back

## 2019-03-28 ENCOUNTER — Ambulatory Visit: Payer: BC Managed Care – PPO | Admitting: Gastroenterology

## 2019-03-31 ENCOUNTER — Ambulatory Visit: Payer: BC Managed Care – PPO | Admitting: Gastroenterology

## 2019-04-27 ENCOUNTER — Ambulatory Visit: Payer: BLUE CROSS/BLUE SHIELD | Admitting: Urology

## 2019-05-03 ENCOUNTER — Ambulatory Visit: Payer: Managed Care, Other (non HMO) | Admitting: Gastroenterology

## 2019-05-03 ENCOUNTER — Encounter (INDEPENDENT_AMBULATORY_CARE_PROVIDER_SITE_OTHER): Payer: Self-pay

## 2019-05-03 ENCOUNTER — Other Ambulatory Visit: Payer: Self-pay

## 2019-05-03 ENCOUNTER — Encounter: Payer: Self-pay | Admitting: Gastroenterology

## 2019-05-03 VITALS — BP 149/77 | HR 91 | Temp 98.1°F | Wt 140.0 lb

## 2019-05-03 DIAGNOSIS — K279 Peptic ulcer, site unspecified, unspecified as acute or chronic, without hemorrhage or perforation: Secondary | ICD-10-CM | POA: Diagnosis not present

## 2019-05-03 NOTE — Progress Notes (Signed)
Cephas Darby, MD [address]  [place]  [place], [place] [address]  Main: [phone]  Fax: [phone]    Gastroenterology Consultation  Referring Provider:     Birdie Sons, MD Primary Care Physician:  Birdie Sons, MD Primary Gastroenterologist:  Dr. Cephas Darby Reason for Consultation:     Irritable bowel syndrome        HPI:   Cheryl Cooke is a 58 y.o. female referred by Dr. Birdie Sons, MD  for consultation & management of approximately 2 years history of abdominal pain associated with loose stools, abdominal bloating and mild abdominal cramps.  She reports that for the last 1 to 2 years, she has been going through a lot of stress in her family and has been taking care of her husband has been sick for few months and was hospitalized.  She developed PTSD due to being in the hospital with her husband.  She has been undergoing cognitive behavioral therapy.  Patient cannot became tearful that her symptoms were bothering her and she had to restrict diet.  She has been avoiding bread to help with her symptoms.  However, her weight has been stable.  She reports insomnia, takes melatonin at bedtime. She has chronic abdominal pain and underwent sigmoidoscopy back in 1990s and she was told that her colon was loopy and could not undergo full colonoscopy.  Patient has been undergoing annual FOBT for colon cancer screening and has been negative to date.  She denies any other GI symptoms Patient denies alcohol use or smoking tobacco  She has history of Sjogren's syndrome and rheumatoid arthritis, followed by rheumatologist in [place].  Currently on maintenance medication with baricitinib and leflunomide  Follow-up visit 09/10/2018 H. pylori breath test is negative, celiac serologies negative, colonoscopy unremarkable.  She tried amitriptyline 12.5 mg which led to constipation.  She is concerned about abdominal pain.  She is a massage therapist by profession and has  been undergoing various techniques which have tremendously helped her with PTSD but she still has some residual symptoms and would like to know if anything could be done for her abdominal pain.  She and her husband are seeing a counselor but she has not seen a psychologist or psychiatrist for PTSD.  Follow-up tele visit 12/01/2018 Cheryl Cooke has done really well with regards to her GI symptoms since she started counseling for PTSD.  She describes it as " my life turnaround".  She did take Zoloft 25 mg at bedtime for a month.  She totally feels like a different person.  She continues to see the counselor for PTSD.  And, she has been reading a lot of books about pain management, adjustment in PTSD.  She continues to try to avoid food triggers. She had an episode of abdominal pain and hematuria in late January 2020 for which she went to ER, had mild drop in hemoglobin, followed by urology and was suspected to be a kidney stone.  She is no longer describing hematuria.  She wanted to discuss about her EGD findings from 2010 at outside hospital.  She read the note to me which described as LA grade B esophagitis, mucosal rings, erythematous stomach.  She does not know if biopsies were performed.  Follow-up visit 05/03/2019 Patient underwent EGD and found to have few superficial, clean-based, small antral ulcers.  There was no evidence of H. pylori.  Patient's insurance did not approve her omeprazole or pantoprazole.  Currently, she is taking  over-the-counter omeprazole 20 mg 2 pills daily for breakfast.  She reports her epigastric pain has significantly improved.  Overall, her IBS symptoms are well controlled.  NSAIDs: None  Antiplts/Anticoagulants/Anti thrombotics: None  GI Procedures: Sigmoidoscopy 1990s  - The examined portion of the ileum was normal. - One 5 mm polyp in the cecum, removed with a cold snare. Resected and retrieved. - Normal mucosa in the entire examined colon. Biopsied. - The distal  rectum and anal verge are normal on retroflexion view.  DIAGNOSIS:  A. COLON POLYP, CECUM; COLD SNARE:  - COLONIC MUCOSA WITH PROMINENT LYMPHOID AGGREGATE.  - NEGATIVE FOR MICROSCOPIC COLITIS, DYSPLASIA, AND MALIGNANCY  (ADDITIONAL DEEPER SECTIONS REVIEWED).   B. COLON; RANDOM COLD BIOPSY:  - COLONIC MUCOSA WITH INTACT CRYPT ARCHITECTURE.  - NEGATIVE FOR ACTIVE INFLAMMATION, MICROSCOPIC COLITIS, AND DYSPLASIA.   EGD 01/27/2019 - Normal duodenal bulb and second portion of the duodenum. - Non-bleeding gastric ulcers with a clean ulcer base (Forrest Class III). - Normal gastric fundus, gastric body and lesser curvature of the stomach. Biopsied. - Esophagogastric landmarks identified. - Normal gastroesophageal junction and esophagus. Biopsied.  DIAGNOSIS:  A. STOMACH; RANDOM COLD BIOPSY:  - OXYNTIC AND TRANSITIONAL-TYPE MUCOSA WITHOUT PATHOLOGIC CHANGES.  - NEGATIVE FOR H. PYLORI, INTESTINAL METAPLASIA, DYSPLASIA, AND  MALIGNANCY.   B. ESOPHAGUS, DISTAL; COLD BIOPSY:  - STRATIFIED SQUAMOUS EPITHELIUM WITHOUT EOSINOPHILS, NEUTROPHILS, OR  REACTIVE CHANGES.  - NEGATIVE FOR DYSPLASIA AND MALIGNANCY.   C. ESOPHAGUS, PROXIMAL; COLD BIOPSY:  - STRATIFIED SQUAMOUS EPITHELIUM WITHOUT EOSINOPHILS, NEUTROPHILS, OR  REACTIVE CHANGES.  - NEGATIVE FOR DYSPLASIA AND MALIGNANCY.   Past Medical History:  Diagnosis Date  . Absolute anemia 02/26/2015   During pregnancy.   . Anxiety   . Collagen vascular disease (HCC)    Hx Rheumatoid Arthritis  . Colon cancer screening   . Depression   . Dysplasia of cervix, low grade (CIN 1) 2005   OBSERVATION  . H/O: vasectomy    HUSBAND HAS HAD VASECTOMY  . Herpes progenitalis     Past Surgical History:  Procedure Laterality Date  . BREAST SURGERY Bilateral 1979   REDUCTION  . COLONOSCOPY WITH PROPOFOL N/A 08/26/2018   Procedure: COLONOSCOPY WITH PROPOFOL;  Surgeon: Lin Landsman, MD;  Location: [facility] ENDOSCOPY;  Service:  Gastroenterology;  Laterality: N/A;  . ESOPHAGOGASTRODUODENOSCOPY (EGD) WITH PROPOFOL N/A 01/27/2019   Procedure: ESOPHAGOGASTRODUODENOSCOPY (EGD) WITH PROPOFOL;  Surgeon: Lin Landsman, MD;  Location: [facility] ENDOSCOPY;  Service: Gastroenterology;  Laterality: N/A;  . FOOT SURGERY  1979 AND 2002   RIGHT 1979, LEFT 2002; Bunion and hammertoes  . REDUCTION MAMMAPLASTY Bilateral 1978  . VASECTOMY      Current Outpatient Medications:  .  aspirin EC 81 MG tablet, Take 81 mg by mouth daily., Disp: , Rfl:  .  Baricitinib (OLUMIANT) 2 MG TABS, Take 1 mg by mouth daily., Disp: , Rfl:  .  calcium-vitamin D (OSCAL WITH D) 250-125 MG-UNIT per tablet, Take 1 tablet by mouth daily. , Disp: , Rfl:  .  EPINEPHrine 0.3 mg/0.3 mL IJ SOAJ injection, U UTD, Disp: , Rfl:  .  fluticasone (FLONASE) 50 MCG/ACT nasal spray, , Disp: , Rfl:  .  leflunomide (ARAVA) 20 MG tablet, Take 20 mg by mouth daily. , Disp: , Rfl:  .  omeprazole (PRILOSEC) 40 MG capsule, Take 1 capsule (40 mg total) by mouth daily before breakfast for 30 days., Disp: 30 capsule, Rfl: 2    Family History  Problem Relation Age of Onset  .  Diabetes Mother   . Hypertension Mother   . Alzheimer's disease Mother   . Allergies Mother   . Stroke Mother   . Heart attack Mother   . Heart disease Father   . Gout Father   . Heart attack Father   . Cancer Maternal Grandmother        UTERINE CANCER  . Stroke Maternal Grandfather   . Bladder Cancer Neg Hx   . Kidney cancer Neg Hx   . Prostate cancer Neg Hx      Social History   Tobacco Use  . Smoking status: Former Smoker    Years: 3.00    Quit date: 08/11/1983    Years since quitting: 35.7  . Smokeless tobacco: Never Used  . Tobacco comment: was a social smoker  Substance Use Topics  . Alcohol use: Yes    Alcohol/week: 0.0 standard drinks    Comment: occasionally  . Drug use: No    Allergies as of 05/03/2019 - Review Complete 05/03/2019  Allergen Reaction Noted  .  Clarithromycin Nausea Only 02/26/2011  . Keflex [cephalexin] Nausea Only 05/19/2016  . Levofloxacin  02/26/2015  . Shrimp [shellfish allergy] Swelling 02/26/2011  . Sulfa antibiotics Swelling 02/26/2011    Review of Systems:    All systems reviewed and negative except where noted in HPI.   Physical Exam:  BP (!) 149/77 (BP Location: Left Arm, Patient Position: Sitting, Cuff Size: Normal)   Pulse 91   Temp 98.1 F (36.7 C) (Oral)   Wt 140 lb (63.5 kg)   LMP  (LMP Unknown)   BMI 24.80 kg/m  No LMP recorded (lmp unknown). Patient is postmenopausal.  General:   Alert,  Well-developed, well-nourished, pleasant and cooperative in NAD Head:  Normocephalic and atraumatic. Eyes:  Sclera clear, no icterus.   Conjunctiva pink. Ears:  Normal auditory acuity. Nose:  No deformity, discharge, or lesions. Mouth:  No deformity or lesions,oropharynx pink & moist. Neck:  Supple; no masses or thyromegaly. Lungs:  Respirations even and unlabored.  Clear throughout to auscultation.   No wheezes, crackles, or rhonchi. No acute distress. Heart:  Regular rate and rhythm; no murmurs, clicks, rubs, or gallops. Abdomen:  Normal bowel sounds. Soft, non-tender and non-distended without masses, hepatosplenomegaly or hernias noted.  No guarding or rebound tenderness.   Rectal: Not performed Msk:  Symmetrical, deformities in her bilateral hands from rheumatoid arthritis. Good, equal movement & strength bilaterally. Pulses:  Normal pulses noted. Extremities:  No clubbing or edema.  No cyanosis. Neurologic:  Alert and oriented x3;  grossly normal neurologically. Skin:  Intact without significant lesions or rashes. No jaundice. Psych:  Alert and cooperative. Normal mood and affect.  Imaging Studies: No abdominal imaging  Assessment and Plan:   Cheryl Cooke is a 58 y.o. Caucasian female with history of rheumatoid arthritis, Sjogren's, chronic history of abdominal pain, cramps associated with nonbloody,  loose stools.  She does not have any constitutional symptoms.  She has been going through significant stress in last 22 years, history of PTSD and undergoing cognitive behavioral therapy.  Her history is highly consistent with IBS diarrhea in setting of PTSD. She has made significant progress both psychologically and IBS symptoms.  Recent EGD revealed peptic ulcer disease with no evidence of H. pylori.  H. pylori breath test as well negative.  Currently on omeprazole 20 mg 2 pills daily before breakfast for last 3 months.  Therefore, advised her to reduce to 1 pill a day for about a  month and then stop.  Warned her about rebound reflux symptoms after stopping PPI.  Given that the gastric ulcers were very small and superficial, I do not recommend repeat EGD.  Patient will call me back for follow-up as needed  Follow up as needed   Cephas Darby, MD

## 2019-05-18 ENCOUNTER — Other Ambulatory Visit: Payer: Self-pay

## 2019-05-18 ENCOUNTER — Encounter: Payer: Self-pay | Admitting: Urology

## 2019-05-18 ENCOUNTER — Ambulatory Visit: Payer: Managed Care, Other (non HMO) | Admitting: Urology

## 2019-05-18 VITALS — BP 136/76 | HR 84 | Ht 63.0 in | Wt 139.7 lb

## 2019-05-18 DIAGNOSIS — Z87448 Personal history of other diseases of urinary system: Secondary | ICD-10-CM | POA: Diagnosis not present

## 2019-05-18 NOTE — Progress Notes (Signed)
05/18/2019 10:33 AM   Alyson Locket Doreene Nest 1961-07-16 [id number]  Referring provider: Birdie Sons, MD [address] [place],  [place] [address]  Chief Complaint  Patient presents with  . Hematuria  . Nephrolithiasis    HPI: 58 y.o. female with a history of recurrent UTIs was seen February 2020 after an episode of gross hematuria and left low back/groin pain.  She subsequently developed severe left flank pain and presented to the ED.  Stone protocol CT was unremarkable however her pain had resolved by the time she was seen in the ED.  A CT urogram was performed which showed no abnormalities.  Cystoscopy was also unremarkable.  She denies recurrent hematuria or flank pain.  She has mild OAB symptoms including frequency, urgency with occasional episodes of urge incontinence.  She states her symptoms are not bothersome enough that she desires further management.   PMH: Past Medical History:  Diagnosis Date  . Absolute anemia 02/26/2015   During pregnancy.   . Anxiety   . Collagen vascular disease (HCC)    Hx Rheumatoid Arthritis  . Colon cancer screening   . Depression   . Dysplasia of cervix, low grade (CIN 1) 2005   OBSERVATION  . H/O: vasectomy    HUSBAND HAS HAD VASECTOMY  . Herpes progenitalis     Surgical History: Past Surgical History:  Procedure Laterality Date  . BREAST SURGERY Bilateral 1979   REDUCTION  . COLONOSCOPY WITH PROPOFOL N/A 08/26/2018   Procedure: COLONOSCOPY WITH PROPOFOL;  Surgeon: Lin Landsman, MD;  Location: [facility] ENDOSCOPY;  Service: Gastroenterology;  Laterality: N/A;  . ESOPHAGOGASTRODUODENOSCOPY (EGD) WITH PROPOFOL N/A 01/27/2019   Procedure: ESOPHAGOGASTRODUODENOSCOPY (EGD) WITH PROPOFOL;  Surgeon: Lin Landsman, MD;  Location: [facility] ENDOSCOPY;  Service: Gastroenterology;  Laterality: N/A;  . FOOT SURGERY  1979 AND 2002   RIGHT 1979, LEFT 2002; Bunion and hammertoes  . REDUCTION MAMMAPLASTY Bilateral 1978  .  VASECTOMY      Home Medications:  Allergies as of 05/18/2019      Reactions   Clarithromycin Nausea Only   Keflex [cephalexin] Nausea Only   Dizziness and stomach pain   Levofloxacin    Other reaction(s): Nausea   Shrimp [shellfish Allergy] Swelling   Sulfa Antibiotics Swelling      Medication List       Accurate as of May 18, 2019 10:33 AM. If you have any questions, ask your nurse or doctor.        STOP taking these medications   aspirin EC 81 MG tablet Stopped by: Abbie Sons, MD     TAKE these medications   calcium-vitamin D 250-125 MG-UNIT tablet Commonly known as: OSCAL WITH D Take 1 tablet by mouth daily.   EPINEPHrine 0.3 mg/0.3 mL Soaj injection Commonly known as: EPI-PEN U UTD   fluticasone 50 MCG/ACT nasal spray Commonly known as: FLONASE   leflunomide 20 MG tablet Commonly known as: ARAVA Take 20 mg by mouth daily.   Olumiant 2 MG Tabs Generic drug: Baricitinib Take 1 mg by mouth daily.   omeprazole 40 MG capsule Commonly known as: PriLOSEC Take 1 capsule (40 mg total) by mouth daily before breakfast for 30 days.       Allergies:  Allergies  Allergen Reactions  . Clarithromycin Nausea Only  . Keflex [Cephalexin] Nausea Only    Dizziness and stomach pain  . Levofloxacin     Other reaction(s): Nausea  . Shrimp [Shellfish Allergy] Swelling  . Sulfa  Antibiotics Swelling    Family History: Family History  Problem Relation Age of Onset  . Diabetes Mother   . Hypertension Mother   . Alzheimer's disease Mother   . Allergies Mother   . Stroke Mother   . Heart attack Mother   . Heart disease Father   . Gout Father   . Heart attack Father   . Cancer Maternal Grandmother        UTERINE CANCER  . Stroke Maternal Grandfather   . Bladder Cancer Neg Hx   . Kidney cancer Neg Hx   . Prostate cancer Neg Hx     Social History:  reports that she quit smoking about 35 years ago. She quit after 3.00 years of use. She has never used  smokeless tobacco. She reports current alcohol use. She reports that she does not use drugs.  ROS: UROLOGY Frequent Urination?: Yes Hard to postpone urination?: Yes Burning/pain with urination?: No Get up at night to urinate?: Yes Leakage of urine?: Yes Urine stream starts and stops?: Yes Trouble starting stream?: No Do you have to strain to urinate?: No Blood in urine?: No Urinary tract infection?: No Sexually transmitted disease?: No Injury to kidneys or bladder?: No Painful intercourse?: No Weak stream?: No Currently pregnant?: No Vaginal bleeding?: No Last menstrual period?: N/A  Gastrointestinal Nausea?: No Vomiting?: No Indigestion/heartburn?: Yes Diarrhea?: No Constipation?: No  Constitutional Fever: No Night sweats?: No Weight loss?: No Fatigue?: No  Skin Skin rash/lesions?: No Itching?: No  Eyes Blurred vision?: No Double vision?: No  Ears/Nose/Throat Sore throat?: No Sinus problems?: Yes  Hematologic/Lymphatic Swollen glands?: No Easy bruising?: No  Cardiovascular Leg swelling?: No Chest pain?: No  Respiratory Cough?: No Shortness of breath?: No  Endocrine Excessive thirst?: No  Musculoskeletal Back pain?: No Joint pain?: Yes  Neurological Headaches?: No Dizziness?: No  Psychologic Depression?: No Anxiety?: No  Physical Exam: BP 136/76 (BP Location: Left Arm, Patient Position: Sitting, Cuff Size: Normal)   Pulse 84   Ht 5\' 3"  (1.6 m)   Wt 139 lb 11.2 oz (63.4 kg)   LMP  (LMP Unknown)   BMI 24.75 kg/m   Constitutional:  Alert and oriented, No acute distress. HEENT: [place] AT, moist mucus membranes.  Trachea midline, no masses. Cardiovascular: No clubbing, cyanosis, or edema. Respiratory: Normal respiratory effort, no increased work of breathing.   Assessment & Plan:    - History of hematuria Denies recurrent pain or gross hematuria.  KUB was ordered and she will be notified with results.   Return in about 1 year (around  05/17/2020) for Recheck.   Abbie Sons, [place] [address], [place], [place] [address] [phone]

## 2019-05-24 ENCOUNTER — Ambulatory Visit
Admission: RE | Admit: 2019-05-24 | Discharge: 2019-05-24 | Disposition: A | Discharge status: Home / Self Care | Payer: Managed Care, Other (non HMO) | Attending: Urology | Admitting: Urology

## 2019-05-24 ENCOUNTER — Ambulatory Visit
Admission: RE | Admit: 2019-05-24 | Discharge: 2019-05-24 | Disposition: A | Discharge status: Home / Self Care | Payer: Managed Care, Other (non HMO) | Source: Ambulatory Visit | Attending: Urology | Admitting: Urology

## 2019-05-24 ENCOUNTER — Other Ambulatory Visit: Payer: Self-pay

## 2019-05-24 DIAGNOSIS — R310 Gross hematuria: Secondary | ICD-10-CM | POA: Diagnosis not present

## 2019-05-24 DIAGNOSIS — Z87898 Personal history of other specified conditions: Secondary | ICD-10-CM

## 2019-05-25 ENCOUNTER — Telehealth: Payer: Self-pay

## 2019-05-25 NOTE — Telephone Encounter (Signed)
Called pt informed her of the information below. Pt gave verbal understanding.  

## 2019-05-25 NOTE — Telephone Encounter (Signed)
-----   Message from Abbie Sons, MD sent at 05/25/2019  7:22 AM EDT ----- KUB reviewed-no stone seen.

## 2019-05-26 ENCOUNTER — Ambulatory Visit: Payer: Managed Care, Other (non HMO) | Admitting: Gastroenterology

## 2019-06-13 NOTE — Progress Notes (Signed)
[name], Female    DOB: Sep 26, 1960, 58 y.o.   MRN: [id number] Visit Date: 06/14/2019  Today's Provider: Lelon Huh, MD   Chief Complaint  Patient presents with  . Annual Exam   Subjective:     Annual physical exam Cheryl Cooke is a 58 y.o. female who presents today for health maintenance and complete physical. She feels fairly well. She reports no regular exercising. She reports she is sleeping poorly.  -----------------------------------------------------------------  Lipid/Cholesterol, Follow-up:    She was prescribed pravastatin last year when her LDL was 171 , but never started medication due to concerns about liver toxicity. She did improve her diet and LDL was much better, down to 131, when rechecked in January. However, she reports she has since discussed this with her rheumatologist who felt that her cholesterol should be treated with statins if still elevated.   Current symptoms include none  Weight trend: stable Prior visit with dietician: no Current diet: in general, an "unhealthy" diet Current exercise: none  Wt Readings from Last 3 Encounters:  06/14/19 140 lb (63.5 kg)  05/18/19 139 lb 11.2 oz (63.4 kg)  05/03/19 140 lb (63.5 kg)    -------------------------------------------------------------------   Review of Systems  Constitutional: Positive for activity change.  HENT: Positive for congestion.   Eyes: Negative.   Respiratory: Negative.   Cardiovascular: Negative.   Gastrointestinal: Negative.   Endocrine: Negative.   Genitourinary: Positive for frequency, genital sores and urgency.  Musculoskeletal: Positive for arthralgias and joint swelling.  Skin: Negative.   Allergic/Immunologic: Positive for environmental allergies and immunocompromised state.  Neurological: Positive for numbness.  Hematological: Negative.   Psychiatric/Behavioral: Positive for sleep disturbance. The patient is nervous/anxious.      Social History      She  reports that she quit smoking about 35 years ago. She quit after 3.00 years of use. She has never used smokeless tobacco. She reports current alcohol use. She reports that she does not use drugs.       Social History   Socioeconomic History  . Marital status: Married    Spouse name: Not on file  . Number of children: 1  . Years of education: H/S  . Highest education level: Not on file  Occupational History  . Occupation: Geophysicist/field seismologist  Social Needs  . Financial resource strain: Not on file  . Food insecurity    Worry: Not on file    Inability: Not on file  . Transportation needs    Medical: Not on file    Non-medical: Not on file  Tobacco Use  . Smoking status: Former Smoker    Years: 3.00    Quit date: 08/11/1983    Years since quitting: 35.8  . Smokeless tobacco: Never Used  . Tobacco comment: was a social smoker  Substance and Sexual Activity  . Alcohol use: Yes    Alcohol/week: 0.0 standard drinks    Comment: occasionally  . Drug use: No  . Sexual activity: Yes    Birth control/protection: Post-menopausal  Lifestyle  . Physical activity    Days per week: Not on file    Minutes per session: Not on file  . Stress: Not on file  Relationships  . Social Herbalist on phone: Not on file    Gets together: Not on file    Attends religious service: Not on file    Active member of club or organization: Not on file  Attends meetings of clubs or organizations: Not on file    Relationship status: Not on file  Other Topics Concern  . Not on file  Social History Narrative  . Not on file    Past Medical History:  Diagnosis Date  . Absolute anemia 02/26/2015   During pregnancy.   . Anxiety   . Collagen vascular disease (HCC)    Hx Rheumatoid Arthritis  . Colon cancer screening   . Depression   . Dysplasia of cervix, low grade (CIN 1) 2005   OBSERVATION  . H/O: vasectomy    HUSBAND HAS HAD VASECTOMY  . Herpes progenitalis       Patient Active Problem List   Diagnosis Date Noted  . Dysphagia   . Chronic gastric ulcer without hemorrhage and without perforation   . Recurrent UTI 09/10/2018  . Hematuria 09/06/2018  . Immunocompromised state due to drug therapy 06/10/2018  . Allergic rhinitis 02/26/2015  . Clinical depression 02/26/2015  . Acid reflux 02/26/2015  . Hypercholesteremia 02/26/2015  . IBS (irritable bowel syndrome) 02/26/2015  . Late luteal phase dysphoric disorder (LLPDD) 02/26/2015  . Arthritis or polyarthritis, rheumatoid ([place]) 02/26/2015  . Gougerout-Sjoegren syndrome 02/26/2015  . Herpes progenitalis     Past Surgical History:  Procedure Laterality Date  . BREAST SURGERY Bilateral 1979   REDUCTION  . COLONOSCOPY WITH PROPOFOL N/A 08/26/2018   Procedure: COLONOSCOPY WITH PROPOFOL;  Surgeon: Lin Landsman, MD;  Location: [facility] ENDOSCOPY;  Service: Gastroenterology;  Laterality: N/A;  . ESOPHAGOGASTRODUODENOSCOPY (EGD) WITH PROPOFOL N/A 01/27/2019   Procedure: ESOPHAGOGASTRODUODENOSCOPY (EGD) WITH PROPOFOL;  Surgeon: Lin Landsman, MD;  Location: [facility] ENDOSCOPY;  Service: Gastroenterology;  Laterality: N/A;  . FOOT SURGERY  1979 AND 2002   RIGHT 1979, LEFT 2002; Bunion and hammertoes  . REDUCTION MAMMAPLASTY Bilateral 1978  . VASECTOMY      Family History        Family Status  Relation Name Status  . Mother  Deceased  . Father  (Not Specified)  . MGM  (Not Specified)  . MGF  (Not Specified)  . Neg Hx  (Not Specified)        Her family history includes Allergies in her mother; Alzheimer's disease in her mother; Cancer in her maternal grandmother; Diabetes in her mother; Gout in her father; Heart attack in her father and mother; Heart disease in her father; Hypertension in her mother; Stroke in her maternal grandfather and mother. There is no history of Bladder Cancer, Kidney cancer, or Prostate cancer.      Allergies  Allergen Reactions  . Clarithromycin Nausea Only   . Keflex [Cephalexin] Nausea Only    Dizziness and stomach pain  . Levofloxacin     Other reaction(s): Nausea  . Shrimp [Shellfish Allergy] Swelling  . Sulfa Antibiotics Swelling     Current Outpatient Medications:  .  calcium-vitamin D (OSCAL WITH D) 250-125 MG-UNIT per tablet, Take 1 tablet by mouth daily. , Disp: , Rfl:  .  EPINEPHrine 0.3 mg/0.3 mL IJ SOAJ injection, U UTD, Disp: , Rfl:  .  fluticasone (FLONASE) 50 MCG/ACT nasal spray, , Disp: , Rfl:  .  leflunomide (ARAVA) 20 MG tablet, Take 20 mg by mouth daily. , Disp: , Rfl:  .  omeprazole (PRILOSEC) 40 MG capsule, Take 1 capsule (40 mg total) by mouth daily before breakfast for 30 days., Disp: 30 capsule, Rfl: 2 .  predniSONE (DELTASONE) 5 MG tablet, Take 5 mg by mouth daily as needed. Prescribed by  Rheumatology, Disp: , Rfl:  .  Upadacitinib ER (RINVOQ) 15 MG TB24, Take 1 tablet by mouth daily., Disp: , Rfl:    Patient Care Team: Birdie Sons, MD as PCP - General (Family Medicine) Butch Penny, MD as Referring Physician (Rheumatology) Lin Landsman, MD as Consulting Physician (Gastroenterology)    Objective:    Vitals: BP 102/62 (BP Location: Left Arm, Patient Position: Sitting, Cuff Size: Normal)   Pulse 71   Temp (!) 97.1 F (36.2 C) (Temporal)   Resp 16   Ht 5\' 3"  (1.6 m)   Wt 140 lb (63.5 kg)   LMP  (LMP Unknown)   SpO2 98% Comment: room air  BMI 24.80 kg/m    Vitals:   06/14/19 0854  BP: 102/62  Pulse: 71  Resp: 16  Temp: (!) 97.1 F (36.2 C)  TempSrc: Temporal  SpO2: 98%  Weight: 140 lb (63.5 kg)  Height: 5\' 3"  (1.6 m)     Physical Exam   General Appearance:    Well developed, well nourished female. Alert, cooperative, in no distress, appears stated age   Head:    Normocephalic, without obvious abnormality, atraumatic  Eyes:    PERRL, conjunctiva/corneas clear, EOM's intact, fundi    benign, both eyes  Ears:    Normal TM's and external ear canals, both ears  Nose:   Nares  normal, septum midline, mucosa normal, no drainage    or sinus tenderness  Throat:   Lips, mucosa, and tongue normal; teeth and gums normal  Neck:   Supple, symmetrical, trachea midline, no adenopathy;    thyroid:  no enlargement/tenderness/nodules; no carotid   bruit or JVD  Back:     Symmetric, no curvature, ROM normal, no CVA tenderness  Lungs:     Clear to auscultation bilaterally, respirations unlabored  Chest Wall:    No tenderness or deformity   Heart:    Normal heart rate. Normal rhythm. No murmurs, rubs, or gallops.   Breast Exam:    normal appearance, no masses or tenderness  Abdomen:     Soft, non-tender, bowel sounds active all four quadrants,    no masses, no organomegaly  Pelvic:    deferred  Extremities:   All extremities are intact. No cyanosis or edema  Pulses:   2+ and symmetric all extremities  Skin:   Skin color, texture, turgor normal, no rashes or lesions  Lymph nodes:   Cervical, supraclavicular, and axillary nodes normal  Neurologic:   CNII-XII intact, normal strength, sensation and reflexes    throughout    Depression Screen PHQ 2/9 Scores 06/14/2019 09/06/2018 06/10/2018 06/05/2017  PHQ - 2 Score 1 0 0 0  PHQ- 9 Score 5 - - 2       Assessment & Plan:     Routine Health Maintenance and Physical Exam  Exercise Activities and Dietary recommendations Goals    . Exercise 150 minutes per week (moderate activity)       Immunization History  Administered Date(s) Administered  . Influenza Split 07/22/2006  . Influenza,inj,Quad PF,6+ Mos 05/09/2015, 05/19/2016, 06/05/2017, 06/10/2018  . Pneumococcal Conjugate-13 06/10/2018  . Tdap 04/18/2014  . Zoster Recombinat (Shingrix) 06/10/2018, 08/16/2018    Health Maintenance  Topic Date Due  . HIV Screening  04/01/1975  . INFLUENZA VACCINE  03/12/2019  . MAMMOGRAM  07/03/2019  . PAP SMEAR-Modifier  05/19/2021  . COLONOSCOPY  08/27/2023  . TETANUS/TDAP  04/18/2024  . Hepatitis C Screening  Completed  Discussed health benefits of physical activity, and encouraged her to engage in regular exercise appropriate for her age and condition.    --------------------------------------------------------------------  1. Annual physical exam She is willing to try statin if lipids still elevated. Will take into consideration increased risk of cardiac disease associated with RA.  - Comprehensive metabolic panel - Lipid panel - CBC  2. Rheumatoid arthritis with negative rheumatoid factor, involving unspecified site [facility]) On DMARD per rheumatologist.   3. Need for influenza vaccination  - Flu Vaccine QUAD 6+ mos PF IM (Fluarix Quad PF)  The entirety of the information documented in the History of Present Illness, Review of Systems and Physical Exam were personally obtained by me. Portions of this information were initially documented by Idelle Jo, CMA and reviewed by me for thoroughness and accuracy.   Lelon Huh, MD  [facility]

## 2019-06-14 ENCOUNTER — Encounter: Payer: Self-pay | Admitting: Family Medicine

## 2019-06-14 ENCOUNTER — Other Ambulatory Visit: Payer: Self-pay

## 2019-06-14 ENCOUNTER — Ambulatory Visit (INDEPENDENT_AMBULATORY_CARE_PROVIDER_SITE_OTHER): Payer: Managed Care, Other (non HMO) | Admitting: Family Medicine

## 2019-06-14 VITALS — BP 102/62 | HR 71 | Temp 97.1°F | Resp 16 | Ht 63.0 in | Wt 140.0 lb

## 2019-06-14 DIAGNOSIS — Z0000 Encounter for general adult medical examination without abnormal findings: Secondary | ICD-10-CM | POA: Diagnosis not present

## 2019-06-14 DIAGNOSIS — Z23 Encounter for immunization: Secondary | ICD-10-CM | POA: Diagnosis not present

## 2019-06-14 DIAGNOSIS — M0600 Rheumatoid arthritis without rheumatoid factor, unspecified site: Secondary | ICD-10-CM

## 2019-06-14 NOTE — Patient Instructions (Addendum)
.   Please review the attached list of medications and notify my office if there are any errors.   . Please bring all of your medications to every appointment so we can make sure that our medication list is the same as yours.   . Please call the [facility] ([phone]) to schedule a routine screening mammogram.  

## 2019-06-15 ENCOUNTER — Telehealth: Payer: Self-pay

## 2019-06-15 DIAGNOSIS — E7800 Pure hypercholesterolemia, unspecified: Secondary | ICD-10-CM

## 2019-06-15 LAB — COMPREHENSIVE METABOLIC PANEL
ALT: 13 IU/L (ref 0–32)
AST: 23 IU/L (ref 0–40)
Albumin/Globulin Ratio: 2 (ref 1.2–2.2)
Albumin: 4.4 g/dL (ref 3.8–4.9)
Alkaline Phosphatase: 80 IU/L (ref 39–117)
BUN/Creatinine Ratio: 20 (ref 9–23)
BUN: 16 mg/dL (ref 6–24)
Bilirubin Total: 0.4 mg/dL (ref 0.0–1.2)
CO2: 24 mmol/L (ref 20–29)
Calcium: 9.9 mg/dL (ref 8.7–10.2)
Chloride: 105 mmol/L (ref 96–106)
Creatinine, Ser: 0.79 mg/dL (ref 0.57–1.00)
GFR calc Af Amer: 95 mL/min/{1.73_m2} (ref >59)
GFR calc non Af Amer: 82 mL/min/{1.73_m2} (ref >59)
Globulin, Total: 2.2 g/dL (ref 1.5–4.5)
Glucose: 91 mg/dL (ref 65–99)
Potassium: 4.9 mmol/L (ref 3.5–5.2)
Sodium: 141 mmol/L (ref 134–144)
Total Protein: 6.6 g/dL (ref 6.0–8.5)

## 2019-06-15 LAB — CBC
Hematocrit: 35 % (ref 34.0–46.6)
Hemoglobin: 11.6 g/dL (ref 11.1–15.9)
MCH: 30.5 pg (ref 26.6–33.0)
MCHC: 33.1 g/dL (ref 31.5–35.7)
MCV: 92 fL (ref 79–97)
Platelets: 248 10*3/uL (ref 150–450)
RBC: 3.8 x10E6/uL (ref 3.77–5.28)
RDW: 13.3 % (ref 11.7–15.4)
WBC: 5.2 10*3/uL (ref 3.4–10.8)

## 2019-06-15 LAB — LIPID PANEL
Chol/HDL Ratio: 3.3 ratio (ref 0.0–4.4)
Cholesterol, Total: 293 mg/dL — ABNORMAL HIGH (ref 100–199)
HDL: 90 mg/dL (ref >39)
LDL Chol Calc (NIH): 185 mg/dL — ABNORMAL HIGH (ref 0–99)
Triglycerides: 106 mg/dL (ref 0–149)
VLDL Cholesterol Cal: 18 mg/dL (ref 5–40)

## 2019-06-15 NOTE — Telephone Encounter (Signed)
LVMTRC 

## 2019-06-15 NOTE — Telephone Encounter (Signed)
-----   Message from Birdie Sons, MD sent at 06/15/2019  7:42 AM EST ----- Cholesterol is very high again. Rest of labs are normal. Need to go ahead and start pravastatin 40mg  once a day, #30 rf x 1 Need to check lipids and liver functions in 6-8 weeks. Will contact her in late December to set up labs. Call if any problems or side effects from medication.

## 2019-06-16 MED ORDER — PRAVASTATIN SODIUM 40 MG PO TABS: 40.0000 mg | ORAL_TABLET | Freq: Every day | ORAL | 1 refills | Status: DC

## 2019-06-16 NOTE — Telephone Encounter (Signed)
Patient returned call and was advised. Medication send into pharmacy.

## 2019-07-04 LAB — HM MAMMOGRAPHY

## 2019-07-28 ENCOUNTER — Telehealth: Payer: Self-pay | Admitting: Family Medicine

## 2019-07-28 DIAGNOSIS — E7800 Pure hypercholesterolemia, unspecified: Secondary | ICD-10-CM

## 2019-07-28 NOTE — Telephone Encounter (Signed)
FYI

## 2019-07-28 NOTE — Telephone Encounter (Signed)
Tried calling patient. Left message to call back. OK for [facility] nurse to advise patient, then route message back to office.

## 2019-07-28 NOTE — Telephone Encounter (Signed)
Pt given information per Dr Jari Favre; she states she is not having problems; pt given lab operating hours, and notified these are fastnig labs; she verbalized understanding; will route to office for notification.

## 2019-07-28 NOTE — Telephone Encounter (Signed)
Pt was started on pravastatin last month for cholesterol. Please check and make sure she is doing ok with medication, and that if she is taking it we need to check lipids to make sure it is working.

## 2019-08-01 ENCOUNTER — Other Ambulatory Visit: Payer: Self-pay | Admitting: Family Medicine

## 2019-08-02 ENCOUNTER — Telehealth: Payer: Self-pay

## 2019-08-02 DIAGNOSIS — E7800 Pure hypercholesterolemia, unspecified: Secondary | ICD-10-CM

## 2019-08-02 LAB — LIPID PANEL
Chol/HDL Ratio: 2.7 ratio (ref 0.0–4.4)
Cholesterol, Total: 231 mg/dL — ABNORMAL HIGH (ref 100–199)
HDL: 87 mg/dL (ref >39)
LDL Chol Calc (NIH): 126 mg/dL — ABNORMAL HIGH (ref 0–99)
Triglycerides: 103 mg/dL (ref 0–149)
VLDL Cholesterol Cal: 18 mg/dL (ref 5–40)

## 2019-08-02 LAB — COMPREHENSIVE METABOLIC PANEL
ALT: 16 IU/L (ref 0–32)
AST: 25 IU/L (ref 0–40)
Albumin/Globulin Ratio: 2 (ref 1.2–2.2)
Albumin: 4.3 g/dL (ref 3.8–4.9)
Alkaline Phosphatase: 73 IU/L (ref 39–117)
BUN/Creatinine Ratio: 19 (ref 9–23)
BUN: 17 mg/dL (ref 6–24)
Bilirubin Total: 0.4 mg/dL (ref 0.0–1.2)
CO2: 23 mmol/L (ref 20–29)
Calcium: 9.6 mg/dL (ref 8.7–10.2)
Chloride: 107 mmol/L — ABNORMAL HIGH (ref 96–106)
Creatinine, Ser: 0.88 mg/dL (ref 0.57–1.00)
GFR calc Af Amer: 83 mL/min/{1.73_m2} (ref >59)
GFR calc non Af Amer: 72 mL/min/{1.73_m2} (ref >59)
Globulin, Total: 2.2 g/dL (ref 1.5–4.5)
Glucose: 92 mg/dL (ref 65–99)
Potassium: 5.4 mmol/L — ABNORMAL HIGH (ref 3.5–5.2)
Sodium: 142 mmol/L (ref 134–144)
Total Protein: 6.5 g/dL (ref 6.0–8.5)

## 2019-08-02 MED ORDER — PRAVASTATIN SODIUM 40 MG PO TABS: 40.0000 mg | ORAL_TABLET | Freq: Every day | ORAL | 3 refills | Status: DC

## 2019-08-02 NOTE — Telephone Encounter (Signed)
Patient advised. Rx sent to pharmacy. 

## 2019-08-02 NOTE — Telephone Encounter (Signed)
-----   Message from Birdie Sons, MD sent at 08/02/2019  8:05 AM EST ----- Cholesterol is a little high, but much better. Down from 293 to 231. LDL is down from 185 to 126. <100 is ideal. Continue current dose of pravastatin and check yearly.  Can send 90 days with 3 refills to her preferred pharmacy.   Her potassium is slightly elevated. If she eats a lot of potassium rich foods such as bananas, oranges, cantaloupe, honeydew, apricots, grapefruit or dark green leafy vegies she should cut back on them.

## 2019-09-12 ENCOUNTER — Encounter: Payer: Self-pay | Admitting: Family Medicine

## 2019-09-12 ENCOUNTER — Other Ambulatory Visit: Payer: Self-pay

## 2019-09-12 ENCOUNTER — Ambulatory Visit: Payer: Managed Care, Other (non HMO) | Admitting: Family Medicine

## 2019-09-12 VITALS — BP 110/74 | HR 74 | Temp 95.9°F | Resp 15 | Wt 142.0 lb

## 2019-09-12 DIAGNOSIS — R200 Anesthesia of skin: Secondary | ICD-10-CM | POA: Diagnosis not present

## 2019-09-12 DIAGNOSIS — L989 Disorder of the skin and subcutaneous tissue, unspecified: Secondary | ICD-10-CM

## 2019-09-12 DIAGNOSIS — R202 Paresthesia of skin: Secondary | ICD-10-CM | POA: Diagnosis not present

## 2019-09-12 DIAGNOSIS — L03115 Cellulitis of right lower limb: Secondary | ICD-10-CM

## 2019-09-12 MED ORDER — VALACYCLOVIR HCL 1 G PO TABS: 1000.0000 mg | ORAL_TABLET | Freq: Every day | ORAL | 12 refills | Status: DC

## 2019-09-12 NOTE — Patient Instructions (Signed)
.   Please review the attached list of medications and notify my office if there are any errors.   . Please bring all of your medications to every appointment so we can make sure that our medication list is the same as yours.    Start taking OTC vitamin B12 1000 units every day   Wash your feet with Hibiclens soap two to three a week

## 2019-09-12 NOTE — Progress Notes (Signed)
[name] Female    DOB: [birth year]   59 y.o.   MRN: [id number] Visit Date: 09/12/2019  Today's Provider: Lelon Huh, MD   Chief Complaint  Patient presents with  . Blister   Subjective:     HPI Patient presents in office today with concerns of what she believes to be blisters on her right foot that has been present for over a month. Patient reports that she has had blister appear and go away but reports for the past 3 weeks she has noticed multiple blisters on her right foot that have grown larger in size. Patient states that blister is hard and at time is filled with fluid and scaly. Patient denies any pain when wearing shoe or pain when bearing weight. Patient denies any past treatment for blisters on her foot. Patient would also like to address numbness and tingling in both her feet for the past 4-6 months, patient reports that she feels that her feet are cold at times but denies that feet our cold to the touch.  Allergies  Allergen Reactions  . Clarithromycin Nausea Only  . Keflex [Cephalexin] Nausea Only    Dizziness and stomach pain  . Levofloxacin     Other reaction(s): Nausea  . Shrimp [Shellfish Allergy] Swelling  . Sulfa Antibiotics Swelling     Current Outpatient Medications:  .  calcium-vitamin D (OSCAL WITH D) 250-125 MG-UNIT per tablet, Take 1 tablet by mouth daily. , Disp: , Rfl:  .  EPINEPHrine 0.3 mg/0.3 mL IJ SOAJ injection, U UTD, Disp: , Rfl:  .  fluticasone (FLONASE) 50 MCG/ACT nasal spray, , Disp: , Rfl:  .  leflunomide (ARAVA) 20 MG tablet, Take 20 mg by mouth daily. , Disp: , Rfl:  .  pravastatin (PRAVACHOL) 40 MG tablet, Take 1 tablet (40 mg total) by mouth daily., Disp: 90 tablet, Rfl: 3 .  predniSONE (DELTASONE) 5 MG tablet, Take 5 mg by mouth daily as needed. Prescribed by Rheumatology, Disp: , Rfl:  .  Upadacitinib ER (RINVOQ) 15 MG TB24, Take 1 tablet by mouth daily., Disp: , Rfl:  .  omeprazole (PRILOSEC) 40 MG capsule,  Take 1 capsule (40 mg total) by mouth daily before breakfast for 30 days., Disp: 30 capsule, Rfl: 2  Review of Systems  Constitutional: Negative.   HENT: Negative.   Respiratory: Negative.   Cardiovascular: Negative.   Gastrointestinal: Negative.   Musculoskeletal: Positive for arthralgias.  Skin: Negative.   Neurological: Positive for numbness.    Social History   Tobacco Use  . Smoking status: Former Smoker    Years: 3.00    Quit date: 08/11/1983    Years since quitting: 36.1  . Smokeless tobacco: Never Used  . Tobacco comment: was a social smoker  Substance Use Topics  . Alcohol use: Yes    Alcohol/week: 0.0 standard drinks    Comment: occasionally      Objective:   BP 110/74   Pulse 74   Temp (!) 95.9 F (35.5 C) (Oral)   Resp 15   Wt 142 lb (64.4 kg)   LMP  (LMP Unknown)   SpO2 99%   BMI 25.15 kg/m  Vitals:   09/12/19 0818  BP: 110/74  Pulse: 74  Resp: 15  Temp: (!) 95.9 F (35.5 C)  TempSrc: Oral  SpO2: 99%  Weight: 142 lb (64.4 kg)  Body mass index is 25.15 kg/m.   Physical Exam  Slightly dull red healing intact  area of skin laterally, small pustule which she states is the beginning of a lesion on dorsal forefoot. No drainage.       Assessment & Plan    1. Numbness and tingling of both feet   2. Skin lesion of foot   3. Cellulitis of right foot (Suspected) - Aerobic culture  Try applying Hibiclens 3 x weekly to cover for staph.  Consider derm referral.      Lelon Huh, MD  [facility]

## 2019-09-16 ENCOUNTER — Telehealth: Payer: Self-pay

## 2019-09-16 NOTE — Telephone Encounter (Signed)
Patient advised as below. Patient verbalizes understanding and is in agreement with treatment plan.  

## 2019-09-16 NOTE — Telephone Encounter (Signed)
-----   Message from Birdie Sons, MD sent at 09/16/2019  4:52 PM EST ----- Culture grows normal skin bacteria. Continue using hibaclens soap about 3 times per week. If not clearing up within 2 weeks she will need referral to dermatology.

## 2019-09-17 LAB — AEROBIC CULTURE

## 2019-09-30 IMAGING — CT CT ABDOMEN AND PELVIS WITHOUT AND WITH CONTRAST
3 of 12 series · 11 of 46 positions shown, 17 images · IV contrast (omnipaque)
Comparison: Noncontrast CT 09/07/2018

CLINICAL DATA: Painless hematuria.

EXAM:
CT ABDOMEN AND PELVIS WITHOUT AND WITH CONTRAST
TECHNIQUE: Multidetector CT imaging of the abdomen and pelvis was performed
following the standard protocol before and following the bolus
administration of intravenous contrast.
CONTRAST:  100mL OMNIPAQUE IOHEXOL 300 MG/ML  SOLN

[Series 2: without pre · axial · non-contrast · 0.66mm/px · z∈[-1559,-1499]mm · 2 of 82 slices shown]
[im 12/82  soft-tissue]
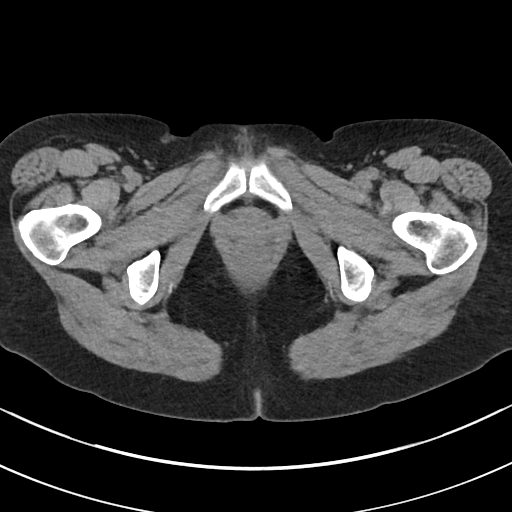
[im 24/82  soft-tissue]
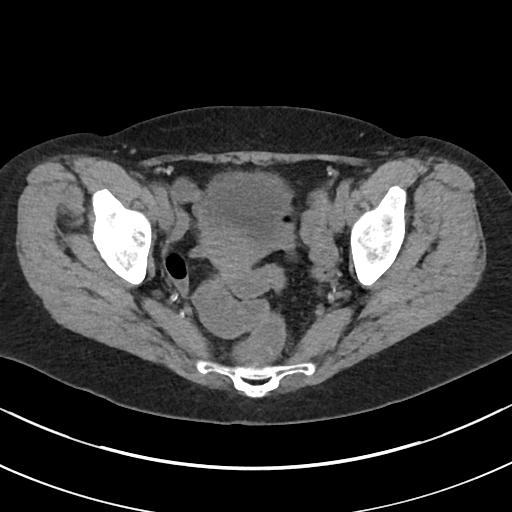

[Series 17: axial delay delay prone · axial · delayed · 0.66mm/px · z∈[-1437,-1117]mm · 7 of 86 slices shown, 12 images]
[im 11/86  soft-tissue]
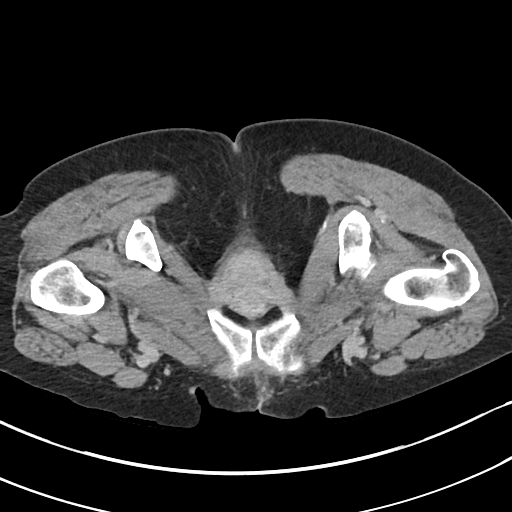
[im 11/86  bone]
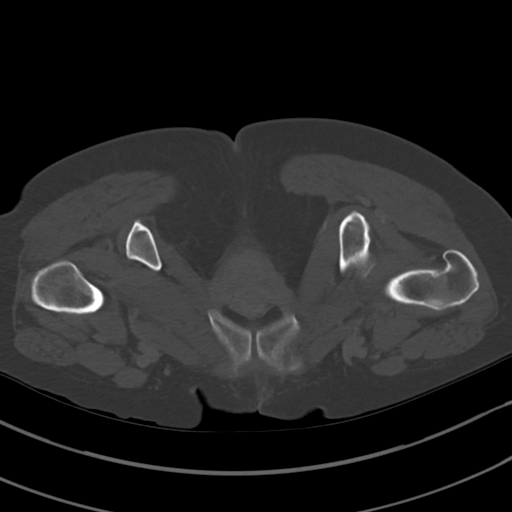
[im 22/86  soft-tissue]
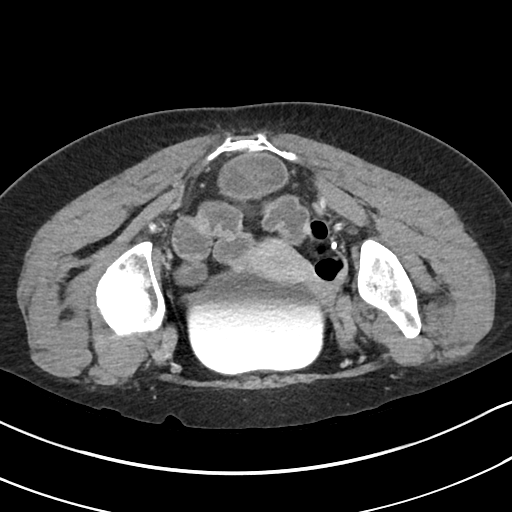
[im 32/86  soft-tissue]
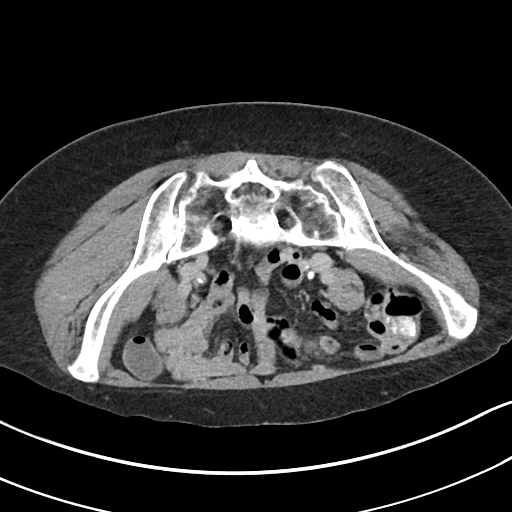
[im 43/86  soft-tissue]
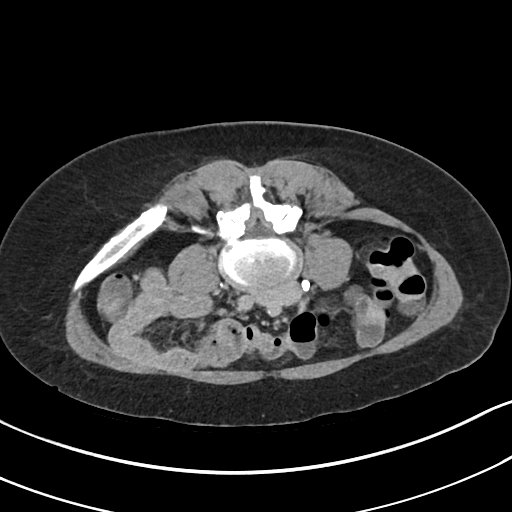
[im 43/86  lung]
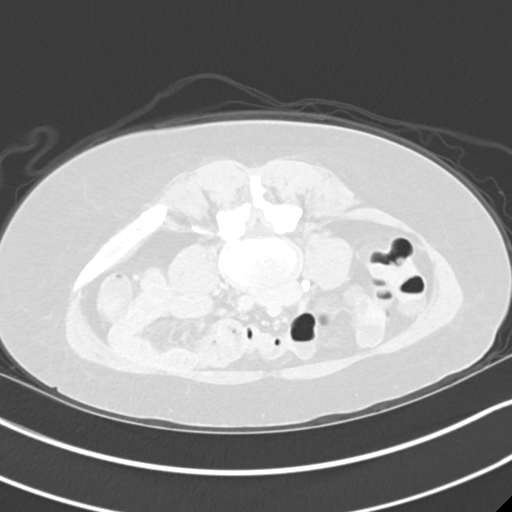
[im 54/86  soft-tissue]
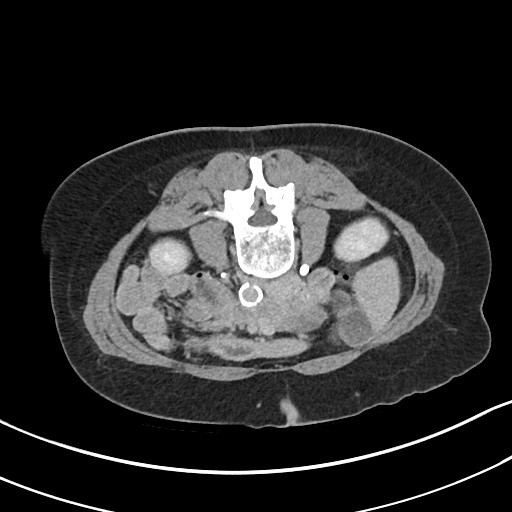
[im 54/86  lung]
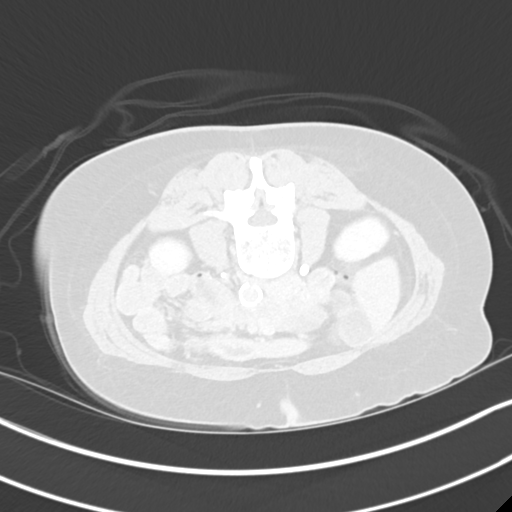
[im 64/86  soft-tissue]
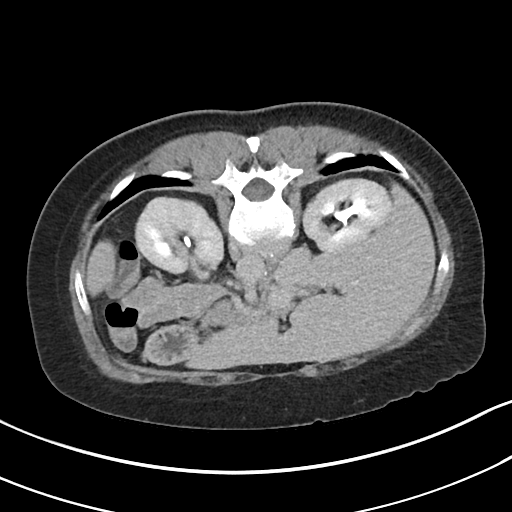
[im 64/86  lung]
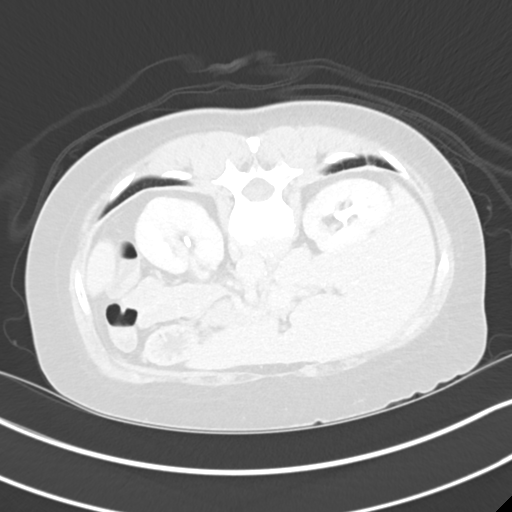
[im 75/86  soft-tissue]
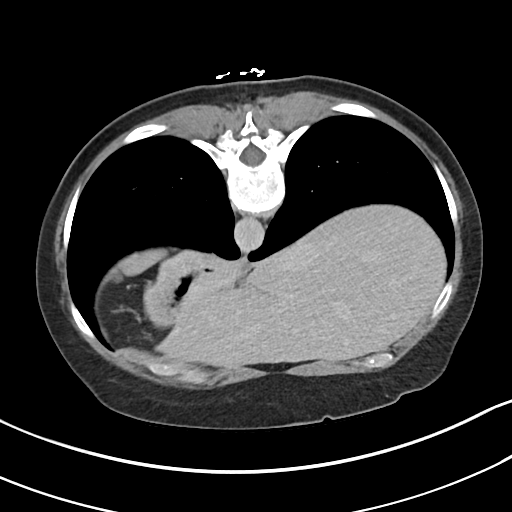
[im 75/86  lung]
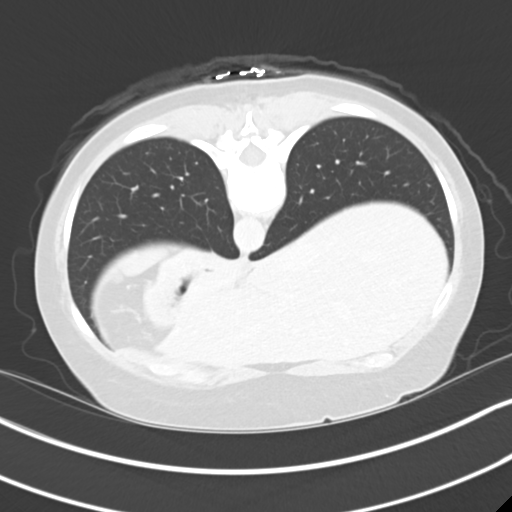

[Series 20: cor delay delay prone · coronal · delayed · 0.66mm/px · 2 of 126 slices shown, 3 images]
[im 42/126  soft-tissue]
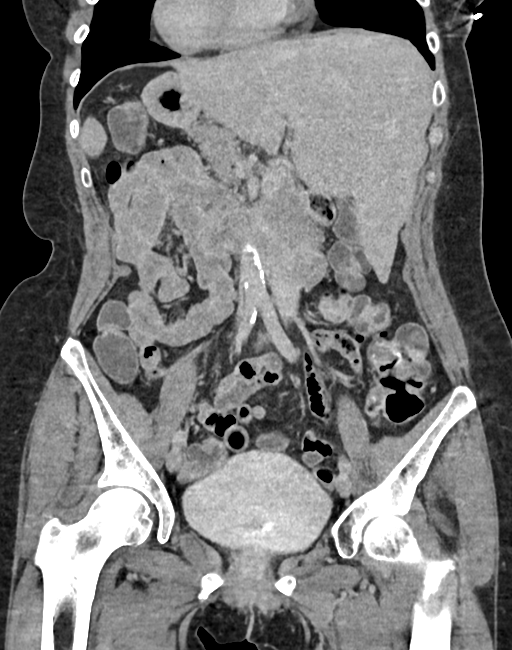
[im 42/126  bone]
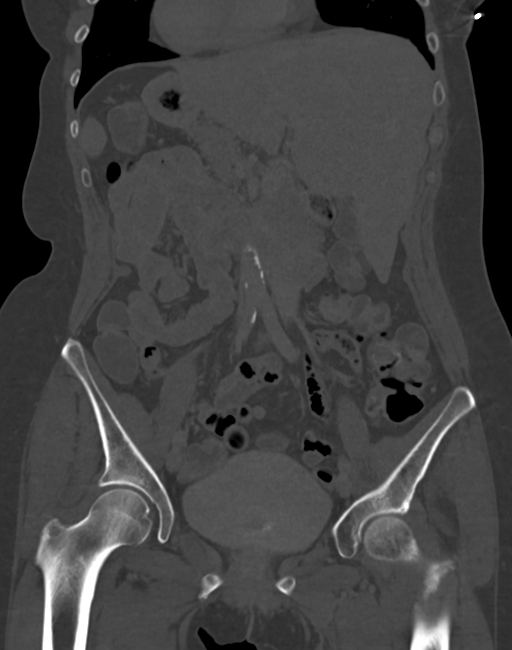
[im 84/126  soft-tissue]
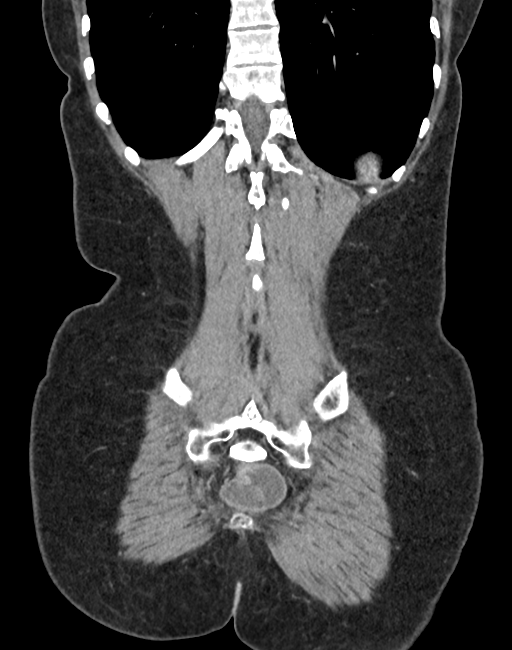

[11 of 46 positions shown; findings below may reference images not displayed]

FINDINGS: Lower chest: Unremarkable

Hepatobiliary: No suspicious focal abnormality within the liver
parenchyma. There is no evidence for gallstones, gallbladder wall
thickening, or pericholecystic fluid. No intrahepatic or
extrahepatic biliary dilation.

Pancreas: No focal mass lesion. No dilatation of the main duct. No
intraparenchymal cyst. No peripancreatic edema.

Spleen: No splenomegaly. No focal mass lesion.

Adrenals/Urinary Tract: No adrenal nodule or mass.

Precontrast imaging shows no stones in either kidney or ureter. No
bladder stones.

Imaging after IV contrast administration shows no suspicious
enhancing lesion in either kidney. 6 mm low-density lesion
interpolar right kidney is too small to characterize but likely a
cyst. 3 mm low-density lesion identified posterior aspect of the
interpolar right kidney.

Delayed imaging reveals no wall thickening or soft tissue filling
defect in either intrarenal collecting system or renal pelvis.
Neither ureter is completely opacified but there is no evidence for
focal dilatation or mass lesion along the course of either ureter.
No focal bladder wall abnormality evident.

Stomach/Bowel: Stomach is unremarkable. No gastric wall thickening.
No evidence of outlet obstruction. Duodenum is normally positioned
as is the ligament of Treitz. No small bowel wall thickening. No
small bowel dilatation. The terminal ileum is normal. The appendix
is normal. No gross colonic mass. No colonic wall thickening.

Vascular/Lymphatic: There is abdominal aortic atherosclerosis
without aneurysm. There is no gastrohepatic or hepatoduodenal
ligament lymphadenopathy. No intraperitoneal or retroperitoneal
lymphadenopathy. No pelvic sidewall lymphadenopathy.

Reproductive: The uterus is unremarkable.  There is no adnexal mass.

Other: No intraperitoneal free fluid.

Musculoskeletal: No worrisome lytic or sclerotic osseous
abnormality.
IMPRESSION: 1. No CT findings to explain the patient's history of hematuria.
2. Tiny right renal lesions, too small to characterize but likely
cysts. Given the history of hematuria, follow-up CT in 6 months may
prove helpful to ensure stability.
3.  Aortic Atherosclerois (G6XY5-170.0)

## 2019-11-16 ENCOUNTER — Telehealth: Payer: Self-pay

## 2019-11-16 DIAGNOSIS — L089 Local infection of the skin and subcutaneous tissue, unspecified: Secondary | ICD-10-CM

## 2019-11-16 DIAGNOSIS — B9689 Other specified bacterial agents as the cause of diseases classified elsewhere: Secondary | ICD-10-CM

## 2019-11-16 NOTE — Telephone Encounter (Signed)
Copied from [place] [id number]. Topic: Referral - Request for Referral >> Nov 16, 2019  2:14 PM Celene Kras wrote: Has patient seen PCP for this complaint? Yes.   *If NO, is insurance requiring patient see PCP for this issue before PCP can refer them? Referral for which specialty: Dermatologist  Preferred provider/office: N/A Pt is requesting to not be placed at [facility] clinic Reason for referral: Pt has a bacterial infection on her foot. Pt states that it was getting better, but it has now come back. Please advise

## 2019-11-16 NOTE — Telephone Encounter (Signed)
Cheryl Sons, MD  09/16/2019  4:52 PM EST    Culture grows normal skin bacteria. Continue using hibaclens soap about 3 times per week. If not clearing up within 2 weeks she will need referral to dermatology.    Please advise patinet.  Per Dr. Sabino Snipes note above ok to place refferal to dermatology, however this was two months ago, if symptoms of infection present she should have an in office visit soon with PCP office to evaluate given time since last seen. Dermatology refferal may be to far in the future and patinet should not wait to be treated. Recommend she be evaluated in office while she waits to be seen by dermatology and ok to place refferal.   Advised patient call the office for an appointment if no improvement within 72 hours or if any symptoms change or worsen at any time  Advised ER or urgent Care if after hours or on weekend. Call 911 for emergency symptoms at any time.

## 2019-11-17 NOTE — Telephone Encounter (Signed)
Patient can see another provider. Thanks

## 2019-11-17 NOTE — Addendum Note (Signed)
Addended by: Jules Schick on: 11/17/2019 11:38 AM   Modules accepted: Orders

## 2019-11-17 NOTE — Telephone Encounter (Signed)
Attempted to contact patient, no answer or voicemail. Okay for PEC to advise patient.  

## 2019-11-17 NOTE — Telephone Encounter (Signed)
Pt. Called back, given Dr. Maralyn Sago message. Pt. Can only come in for a visit on Mondays and Tuesdays. No availability with Dr. Caryn Section seen. Does he need to work her in or can she see another provider. Pt. Is still having skin issues. Please advise pt. Attempted to contact flow coordinator, no answer.

## 2019-11-18 NOTE — Telephone Encounter (Signed)
Third attempt to reach pt. Call goes straight to recording that voice mailbox is not set up.

## 2019-11-18 NOTE — Telephone Encounter (Signed)
Attempted to reach pt. No answer. Unable to leave message, mailbox not set up.

## 2019-11-18 NOTE — Telephone Encounter (Signed)
Attempted to call pt. And schedule her with another provider. Voice mailbox has not bee set, unable to leave a message.

## 2019-11-18 NOTE — Telephone Encounter (Signed)
Please review and advise, PEC has tried reaching out to patient again, this is third attempt. KW

## 2020-05-23 ENCOUNTER — Ambulatory Visit: Payer: Managed Care, Other (non HMO) | Admitting: Urology

## 2020-07-23 ENCOUNTER — Other Ambulatory Visit: Payer: Self-pay

## 2020-07-23 ENCOUNTER — Encounter: Payer: Self-pay | Admitting: Family Medicine

## 2020-07-23 ENCOUNTER — Ambulatory Visit (INDEPENDENT_AMBULATORY_CARE_PROVIDER_SITE_OTHER): Payer: Managed Care, Other (non HMO) | Admitting: Family Medicine

## 2020-07-23 VITALS — BP 128/67 | HR 67 | Temp 97.7°F | Resp 16 | Ht 63.0 in | Wt 137.0 lb

## 2020-07-23 DIAGNOSIS — M0600 Rheumatoid arthritis without rheumatoid factor, unspecified site: Secondary | ICD-10-CM | POA: Diagnosis not present

## 2020-07-23 DIAGNOSIS — Z23 Encounter for immunization: Secondary | ICD-10-CM | POA: Diagnosis not present

## 2020-07-23 DIAGNOSIS — Z0000 Encounter for general adult medical examination without abnormal findings: Secondary | ICD-10-CM | POA: Diagnosis not present

## 2020-07-23 DIAGNOSIS — E7800 Pure hypercholesterolemia, unspecified: Secondary | ICD-10-CM | POA: Diagnosis not present

## 2020-07-23 NOTE — Patient Instructions (Addendum)
.   Please go to the lab draw station in [address] on the second floor of [facility]  when you are fasting for 8 hours. Normal hours are 8:00am to 11:30am and 1:00pm to 4:00pm Monday through Friday  . Covid-19 vaccines: The Covid vaccines have been given to hundreds of millions of people and found to be very effective and are as safe as any other vaccine.  The [employer] vaccine has been associated with very rare dangerous blood clots, but only in adult women under the age of 19.  The risk of dying from Covid infections is much higher than having a serious reaction to the vaccine.  I strongly recommend getting fully vaccinated against Covid-19.  I recommend that adult women under 60 get fully vaccinated, but the [place] vaccines may be safer for those women than the [employer] vaccine.

## 2020-07-23 NOTE — Progress Notes (Signed)
Complete physical exam   [name]   DOB: 07-19-1961   59 y.o. Female  MRN: [id number] Visit Date: 07/23/2020  Today's healthcare provider: Lelon Huh, MD   Chief Complaint  Patient presents with  . Annual Exam   Subjective    Cheryl Cooke is a 59 y.o. female who presents today for a complete physical exam.  She reports consuming a general diet. The patient does not participate in regular exercise at present. She generally feels well. She reports sleeping fairly well. She does not have additional problems to discuss today.   Lipid/Cholesterol, Follow-up  Last lipid panel Other pertinent labs  Lab Results  Component Value Date   CHOL 231 (H) 08/01/2019   HDL 87 08/01/2019   LDLCALC 126 (H) 08/01/2019   TRIG 103 08/01/2019   CHOLHDL 2.7 08/01/2019   Lab Results  Component Value Date   ALT 16 08/01/2019   AST 25 08/01/2019   PLT 248 06/14/2019   TSH 1.600 05/09/2015     She was last seen for this 1 years ago.  Management since that visit includes starting on Pravastatin 40mg  daily.  She reports good compliance with treatment. She is not having side effects.   Symptoms: No chest pain No chest pressure/discomfort  No dyspnea No lower extremity edema  No numbness or tingling of extremity No orthopnea  No palpitations No paroxysmal nocturnal dyspnea  No speech difficulty No syncope   Current diet: well balanced Current exercise: no regular exercise  The 10-year ASCVD risk score Mikey Bussing DC Brooke Bonito., et al., 2013) is: 2.7%   Past Medical History:  Diagnosis Date  . Absolute anemia 02/26/2015   During pregnancy.   . Anxiety   . Collagen vascular disease (HCC)    Hx Rheumatoid Arthritis  . Colon cancer screening   . Depression   . Dysplasia of cervix, low grade (CIN 1) 2005   OBSERVATION  . H/O: vasectomy    HUSBAND HAS HAD VASECTOMY  . Herpes progenitalis    Past Surgical History:  Procedure Laterality Date  . BREAST SURGERY  Bilateral 1979   REDUCTION  . COLONOSCOPY WITH PROPOFOL N/A 08/26/2018   Procedure: COLONOSCOPY WITH PROPOFOL;  Surgeon: Lin Landsman, MD;  Location[facility] ENDOSCOPY;  Service: Gastroenterology;  Laterality: N/A;  . ESOPHAGOGASTRODUODENOSCOPY (EGD) WITH PROPOFOL N/A 01/27/2019   Procedure: ESOPHAGOGASTRODUODENOSCOPY (EGD) WITH PROPOFOL;  Surgeon: Lin Landsman, MD;  Location: [facility] ENDOSCOPY;  Service: Gastroenterology;  Laterality: N/A;  . FOOT SURGERY  1979 AND 2002   RIGHT 1979, LEFT 2002; Bunion and hammertoes  . REDUCTION MAMMAPLASTY Bilateral 1978  . VASECTOMY     Social History   Socioeconomic History  . Marital status: Married    Spouse name: Not on file  . Number of children: 1  . Years of education: H/S  . Highest education level: Not on file  Occupational History  . Occupation: Massage Therapist  Tobacco Use  . Smoking status: Former Smoker    Years: 3.00    Quit date: 08/11/1983    Years since quitting: 36.9  . Smokeless tobacco: Never Used  . Tobacco comment: was a social smoker  Vaping Use  . Vaping Use: Never used  Substance and Sexual Activity  . Alcohol use: Yes    Alcohol/week: 0.0 standard drinks    Comment: occasionally  . Drug use: No  . Sexual activity: Yes    Birth control/protection: Post-menopausal  Other Topics Concern  . Not  on file  Social History Narrative  . Not on file   Social Determinants of Health   Financial Resource Strain: Not on file  Food Insecurity: Not on file  Transportation Needs: Not on file  Physical Activity: Not on file  Stress: Not on file  Social Connections: Not on file  Intimate Partner Violence: Not on file   Family Status  Relation Name Status  . Mother  Deceased  . Father  (Not Specified)  . MGM  (Not Specified)  . MGF  (Not Specified)  . Neg Hx  (Not Specified)   Family History  Problem Relation Age of Onset  . Diabetes Mother   . Hypertension Mother   . Alzheimer's disease Mother   .  Allergies Mother   . Stroke Mother   . Heart attack Mother   . Heart disease Father   . Gout Father   . Heart attack Father   . Cancer Maternal Grandmother        UTERINE CANCER  . Stroke Maternal Grandfather   . Bladder Cancer Neg Hx   . Kidney cancer Neg Hx   . Prostate cancer Neg Hx    Allergies  Allergen Reactions  . Clarithromycin Nausea Only  . Keflex [Cephalexin] Nausea Only    Dizziness and stomach pain  . Levofloxacin     Other reaction(s): Nausea  . Shrimp [Shellfish Allergy] Swelling  . Sulfa Antibiotics Swelling    Patient Care Team: Birdie Sons, MD as PCP - General (Family Medicine) Butch Penny, MD as Consulting Physician (Rheumatology) Lin Landsman, MD as Consulting Physician (Gastroenterology) Jamelle Rushing, MD as Referring Physician (Dermatology)   Medications: Outpatient Medications Prior to Visit  Medication Sig  . calcium-vitamin D (OSCAL WITH D) 250-125 MG-UNIT per tablet Take 1 tablet by mouth daily.   Marland Kitchen EPINEPHrine 0.3 mg/0.3 mL IJ SOAJ injection U UTD  . fluticasone (FLONASE) 50 MCG/ACT nasal spray   . leflunomide (ARAVA) 20 MG tablet Take 20 mg by mouth daily.   . pravastatin (PRAVACHOL) 40 MG tablet Take 1 tablet (40 mg total) by mouth daily.  Marland Kitchen Upadacitinib ER (RINVOQ) 15 MG TB24 Take 1 tablet by mouth daily.  . valACYclovir (VALTREX) 1000 MG tablet Take 1 tablet (1,000 mg total) by mouth daily.  Marland Kitchen omeprazole (PRILOSEC) 40 MG capsule Take 1 capsule (40 mg total) by mouth daily before breakfast for 30 days.  . predniSONE (DELTASONE) 5 MG tablet Take 5 mg by mouth daily as needed. Prescribed by Rheumatology (Patient not taking: Reported on 07/23/2020)   No facility-administered medications prior to visit.    Review of Systems  All other systems reviewed and are negative.     Objective    BP 128/67   Pulse 67   Temp 97.7 F (36.5 C)   Resp 16   Ht 5\' 3"  (1.6 m)   Wt 137 lb (62.1 kg)   LMP  (LMP Unknown)   BMI 24.27  kg/m     General Appearance:    Well developed, well nourished female. Alert, cooperative, in no acute distress, appears stated age   Head:    Normocephalic, without obvious abnormality, atraumatic  Eyes:    PERRL, conjunctiva/corneas clear, EOM's intact, fundi    benign, both eyes  Ears:    Normal TM's and external ear canals, both ears  Nose:   Nares normal, septum midline, mucosa normal, no drainage    or sinus tenderness  Throat:   Lips, mucosa,  and tongue normal; teeth and gums normal  Neck:   Supple, symmetrical, trachea midline, no adenopathy;    thyroid:  no enlargement/tenderness/nodules; no carotid   bruit or JVD  Back:     Symmetric, no curvature, ROM normal, no CVA tenderness  Lungs:     Clear to auscultation bilaterally, respirations unlabored  Chest Wall:    No tenderness or deformity   Heart:    Normal heart rate. Normal rhythm. No murmurs, rubs, or gallops.   Breast Exam:    normal appearance, no masses or tenderness  Abdomen:     Soft, non-tender, bowel sounds active all four quadrants,    no masses, no organomegaly  Pelvic:    deferred  Extremities:   All extremities are intact. No cyanosis or edema  Pulses:   2+ and symmetric all extremities  Skin:   Skin color, texture, turgor normal, no rashes or lesions  Lymph nodes:   Cervical, supraclavicular, and axillary nodes normal  Neurologic:   CNII-XII intact, normal strength, sensation and reflexes    throughout    Last depression screening scores PHQ 2/9 Scores 07/23/2020 06/14/2019 09/06/2018  PHQ - 2 Score 0 1 0  PHQ- 9 Score 1 5 -   Last fall risk screening Fall Risk  07/23/2020  Falls in the past year? 0  Number falls in past yr: 0  Injury with Fall? 0  Risk for fall due to : No Fall Risks  Follow up Falls evaluation completed   Last Audit-C alcohol use screening Alcohol Use Disorder Test (AUDIT) 07/23/2020  1. How often do you have a drink containing alcohol? 2  2. How many drinks containing alcohol  do you have on a typical day when you are drinking? 0  3. How often do you have six or more drinks on one occasion? 0  AUDIT-C Score 2  Alcohol Brief Interventions/Follow-up AUDIT Score <7 follow-up not indicated   A score of 3 or more in women, and 4 or more in men indicates increased risk for alcohol abuse, EXCEPT if all of the points are from question 1   No results found for any visits on 07/23/20.  Assessment & Plan    Routine Health Maintenance and Physical Exam  Exercise Activities and Dietary recommendations Goals    . Exercise 150 minutes per week (moderate activity)       Immunization History  Administered Date(s) Administered  . Influenza Split 07/22/2006  . Influenza,inj,Quad PF,6+ Mos 05/09/2015, 05/19/2016, 06/05/2017, 06/10/2018, 06/14/2019  . Pneumococcal Conjugate-13 06/10/2018  . Tdap 04/18/2014  . Zoster Recombinat (Shingrix) 06/10/2018, 08/16/2018    Health Maintenance  Topic Date Due  . COVID-19 Vaccine (1) Never done  . HIV Screening  Never done  . INFLUENZA VACCINE  03/11/2020  . MAMMOGRAM  07/03/2020  . PAP SMEAR-Modifier  05/19/2021  . COLONOSCOPY  08/27/2023  . TETANUS/TDAP  04/18/2024  . Hepatitis C Screening  Completed    Discussed health benefits of physical activity, and encouraged her to engage in regular exercise appropriate for her age and condition.  1. Annual physical exam Generally doing well. Counseled regarding risks of Covid vaccine and risks of being unvaccinated on immunosuppressant medications. Encouraged vaccination.  - CBC - Comprehensive metabolic panel - Lipid panel  2. Need for influenza vaccination  - Flu Vaccine QUAD 6+ mos PF IM (Fluarix Quad PF)  3. Hypercholesteremia She is tolerating pravastatin well with no adverse effects.   - CBC - Comprehensive metabolic panel - Lipid  panel  4. Rheumatoid arthritis with negative rheumatoid factor, involving unspecified site [facility]) Doing well on current medications  managed by Dr. Leonard Downing.    No follow-ups on file.     The entirety of the information documented in the History of Present Illness, Review of Systems and Physical Exam were personally obtained by me. Portions of this information were initially documented by the CMA and reviewed by me for thoroughness and accuracy.      Lelon Huh, MD  [facility] [phone] (phone) [phone] (fax)  [place]

## 2020-07-27 LAB — HM MAMMOGRAPHY

## 2020-08-02 LAB — CBC
Hematocrit: 36.1 % (ref 34.0–46.6)
Hemoglobin: 11.8 g/dL (ref 11.1–15.9)
MCH: 33.2 pg — ABNORMAL HIGH (ref 26.6–33.0)
MCHC: 32.7 g/dL (ref 31.5–35.7)
MCV: 102 fL — ABNORMAL HIGH (ref 79–97)
Platelets: 303 10*3/uL (ref 150–450)
RBC: 3.55 x10E6/uL — ABNORMAL LOW (ref 3.77–5.28)
RDW: 12.8 % (ref 11.7–15.4)
WBC: 4.7 10*3/uL (ref 3.4–10.8)

## 2020-08-02 LAB — COMPREHENSIVE METABOLIC PANEL
ALT: 12 IU/L (ref 0–32)
AST: 22 IU/L (ref 0–40)
Albumin/Globulin Ratio: 2 (ref 1.2–2.2)
Albumin: 4.4 g/dL (ref 3.8–4.9)
Alkaline Phosphatase: 67 IU/L (ref 44–121)
BUN/Creatinine Ratio: 24 (ref 12–28)
BUN: 21 mg/dL (ref 8–27)
Bilirubin Total: 0.4 mg/dL (ref 0.0–1.2)
CO2: 22 mmol/L (ref 20–29)
Calcium: 10.1 mg/dL (ref 8.7–10.3)
Chloride: 105 mmol/L (ref 96–106)
Creatinine, Ser: 0.86 mg/dL (ref 0.57–1.00)
GFR calc Af Amer: 85 mL/min/{1.73_m2} (ref >59)
GFR calc non Af Amer: 74 mL/min/{1.73_m2} (ref >59)
Globulin, Total: 2.2 g/dL (ref 1.5–4.5)
Glucose: 88 mg/dL (ref 65–99)
Potassium: 5.1 mmol/L (ref 3.5–5.2)
Sodium: 143 mmol/L (ref 134–144)
Total Protein: 6.6 g/dL (ref 6.0–8.5)

## 2020-08-02 LAB — LIPID PANEL
Chol/HDL Ratio: 2.5 ratio (ref 0.0–4.4)
Cholesterol, Total: 240 mg/dL — ABNORMAL HIGH (ref 100–199)
HDL: 96 mg/dL (ref >39)
LDL Chol Calc (NIH): 133 mg/dL — ABNORMAL HIGH (ref 0–99)
Triglycerides: 68 mg/dL (ref 0–149)
VLDL Cholesterol Cal: 11 mg/dL (ref 5–40)

## 2020-08-28 ENCOUNTER — Other Ambulatory Visit: Payer: Self-pay | Admitting: Family Medicine

## 2020-08-28 DIAGNOSIS — E7800 Pure hypercholesterolemia, unspecified: Secondary | ICD-10-CM

## 2020-09-14 ENCOUNTER — Other Ambulatory Visit: Payer: Self-pay | Admitting: Family Medicine

## 2020-09-14 MED ORDER — VALACYCLOVIR HCL 1 G PO TABS: 1000.0000 mg | ORAL_TABLET | Freq: Every day | ORAL | 12 refills | Status: DC

## 2020-09-14 NOTE — Progress Notes (Signed)
Fax request from [employer]

## 2021-04-17 LAB — HM DEXA SCAN

## 2021-04-18 ENCOUNTER — Encounter: Payer: Self-pay | Admitting: Family Medicine

## 2021-04-18 DIAGNOSIS — M8580 Other specified disorders of bone density and structure, unspecified site: Secondary | ICD-10-CM | POA: Insufficient documentation

## 2021-07-30 ENCOUNTER — Other Ambulatory Visit (HOSPITAL_COMMUNITY)
Admission: RE | Admit: 2021-07-30 | Discharge: 2021-07-30 | Disposition: A | Discharge status: Home / Self Care | Payer: 59 | Source: Ambulatory Visit | Attending: Family Medicine | Admitting: Family Medicine

## 2021-07-30 ENCOUNTER — Encounter: Payer: Self-pay | Admitting: Family Medicine

## 2021-07-30 ENCOUNTER — Other Ambulatory Visit: Payer: Self-pay

## 2021-07-30 ENCOUNTER — Ambulatory Visit (INDEPENDENT_AMBULATORY_CARE_PROVIDER_SITE_OTHER): Payer: 59 | Admitting: Family Medicine

## 2021-07-30 VITALS — BP 114/62 | HR 71 | Temp 98.0°F | Resp 16 | Ht 63.0 in | Wt 138.0 lb

## 2021-07-30 DIAGNOSIS — Z79899 Other long term (current) drug therapy: Secondary | ICD-10-CM

## 2021-07-30 DIAGNOSIS — M0600 Rheumatoid arthritis without rheumatoid factor, unspecified site: Secondary | ICD-10-CM

## 2021-07-30 DIAGNOSIS — E7800 Pure hypercholesterolemia, unspecified: Secondary | ICD-10-CM

## 2021-07-30 DIAGNOSIS — D84821 Immunodeficiency due to drugs: Secondary | ICD-10-CM | POA: Diagnosis not present

## 2021-07-30 DIAGNOSIS — Z0000 Encounter for general adult medical examination without abnormal findings: Secondary | ICD-10-CM

## 2021-07-30 DIAGNOSIS — Z124 Encounter for screening for malignant neoplasm of cervix: Secondary | ICD-10-CM | POA: Insufficient documentation

## 2021-07-30 DIAGNOSIS — Z23 Encounter for immunization: Secondary | ICD-10-CM | POA: Diagnosis not present

## 2021-07-30 NOTE — Progress Notes (Signed)
Complete physical exam   [name]   DOB: 1960/09/01   60 y.o. Female  MRN: [id number] Visit Date: 07/30/2021  Today's healthcare provider: Lelon Huh, MD   Chief Complaint  Patient presents with   Annual Exam   Hyperlipidemia   Subjective    Cheryl Cooke is a 60 y.o. female who presents today for a complete physical exam.  She reports consuming a general diet. Gym/ health club routine includes cycle and pilates. She generally feels fairly well. She reports sleeping fairly well. She does not have additional problems to discuss today.  HPI  Lipid/Cholesterol, Follow-up  Last lipid panel Other pertinent labs  Lab Results  Component Value Date   CHOL 240 (H) 08/01/2020   HDL 96 08/01/2020   LDLCALC 133 (H) 08/01/2020   TRIG 68 08/01/2020   CHOLHDL 2.5 08/01/2020   Lab Results  Component Value Date   ALT 12 08/01/2020   AST 22 08/01/2020   PLT 303 08/01/2020   TSH 1.600 05/09/2015     She was last seen for this 1  year  ago.  Management since that visit includes advising patient to cut back on saturated fats and continue current dose of pravastatin. Check yearly.  She reports good compliance with treatment. She is not having side effects.   Symptoms: No chest pain No chest pressure/discomfort  No dyspnea No lower extremity edema  No numbness or tingling of extremity No orthopnea  No palpitations No paroxysmal nocturnal dyspnea  No speech difficulty No syncope   Current diet: in general, a "healthy" diet   Current exercise:  cycle and pilates  The 10-year ASCVD risk score (Arnett DK, et al., 2019) is: 2.4%  ---------------------------------------------------------------------------------------------------   Follow up for rheumatoid arthritis:  The patient was last seen for this 1  year  ago. Changes made at last visit include none. Managed by Dr. Leonard Downing.  She reports good compliance with treatment. Patient now has a new  Rheumatologist names  Dr. Dossie Der. She feels that condition is Unchanged. She is not having side effects.   -----------------------------------------------------------------------------------------   Past Medical History:  Diagnosis Date   Absolute anemia 02/26/2015   During pregnancy.    Anxiety    Collagen vascular disease ([place])    Hx Rheumatoid Arthritis   Colon cancer screening    Depression    Dysplasia of cervix, low grade (CIN 1) 2005   OBSERVATION   H/O: vasectomy    HUSBAND HAS HAD VASECTOMY   Herpes progenitalis    Past Surgical History:  Procedure Laterality Date   BREAST SURGERY Bilateral 1979   REDUCTION   COLONOSCOPY WITH PROPOFOL N/A 08/26/2018   Procedure: COLONOSCOPY WITH PROPOFOL;  Surgeon: Lin Landsman, MD;  Location: [facility] ENDOSCOPY;  Service: Gastroenterology;  Laterality: N/A;   ESOPHAGOGASTRODUODENOSCOPY (EGD) WITH PROPOFOL N/A 01/27/2019   Procedure: ESOPHAGOGASTRODUODENOSCOPY (EGD) WITH PROPOFOL;  Surgeon: Lin Landsman, MD;  Location: [facility] ENDOSCOPY;  Service: Gastroenterology;  Laterality: N/A;   FOOT SURGERY  1979 AND 2002   RIGHT 1979, LEFT 2002; Bunion and hammertoes   REDUCTION MAMMAPLASTY Bilateral 1978   VASECTOMY     Social History   Socioeconomic History   Marital status: Married    Spouse name: Not on file   Number of children: 1   Years of education: H/S   Highest education level: Not on file  Occupational History   Occupation: Massage Therapist  Tobacco Use   Smoking status: Former  Years: 3.00    Types: Cigarettes    Quit date: 08/11/1983    Years since quitting: 37.9   Smokeless tobacco: Never   Tobacco comments:    was a social smoker  Vaping Use   Vaping Use: Never used  Substance and Sexual Activity   Alcohol use: Yes    Alcohol/week: 0.0 standard drinks    Comment: occasionally   Drug use: No   Sexual activity: Yes    Birth control/protection: Post-menopausal  Other Topics Concern   Not on file  Social  History Narrative   Not on file   Social Determinants of Health   Financial Resource Strain: Not on file  Food Insecurity: Not on file  Transportation Needs: Not on file  Physical Activity: Not on file  Stress: Not on file  Social Connections: Not on file  Intimate Partner Violence: Not on file   Family Status  Relation Name Status   Mother  Deceased   Father  (Not Specified)   MGM  (Not Specified)   MGF  (Not Specified)   Neg Hx  (Not Specified)   Family History  Problem Relation Age of Onset   Diabetes Mother    Hypertension Mother    Alzheimer's disease Mother    Allergies Mother    Stroke Mother    Heart attack Mother    Heart disease Father    Gout Father    Heart attack Father    Cancer Maternal Grandmother        UTERINE CANCER   Stroke Maternal Grandfather    Bladder Cancer Neg Hx    Kidney cancer Neg Hx    Prostate cancer Neg Hx    Allergies  Allergen Reactions   Clarithromycin Nausea Only   Keflex [Cephalexin] Nausea Only    Dizziness and stomach pain   Levofloxacin     Other reaction(s): Nausea   Shrimp [Shellfish Allergy] Swelling   Sulfa Antibiotics Swelling    Patient Care Team: Birdie Sons, MD as PCP - General (Family Medicine) Butch Penny, MD as Consulting Physician (Rheumatology) Marius Ditch, Tally Due, MD as Consulting Physician (Gastroenterology) Jamelle Rushing, MD as Referring Physician (Dermatology) Valinda Party, MD (Rheumatology)   Medications: Outpatient Medications Prior to Visit  Medication Sig   calcium-vitamin D (OSCAL WITH D) 250-125 MG-UNIT per tablet Take 1 tablet by mouth daily.    EPINEPHrine 0.3 mg/0.3 mL IJ SOAJ injection U UTD   fluticasone (FLONASE) 50 MCG/ACT nasal spray    leflunomide (ARAVA) 20 MG tablet Take 20 mg by mouth daily.    omeprazole (PRILOSEC) 40 MG capsule Take 1 capsule (40 mg total) by mouth daily before breakfast for 30 days.   pravastatin (PRAVACHOL) 40 MG tablet TAKE 1 TABLET(40 MG) BY  MOUTH DAILY   Upadacitinib ER (RINVOQ) 15 MG TB24 Take 1 tablet by mouth daily.   valACYclovir (VALTREX) 1000 MG tablet Take 1 tablet (1,000 mg total) by mouth daily.   [DISCONTINUED] predniSONE (DELTASONE) 5 MG tablet Take 5 mg by mouth daily as needed. Prescribed by Rheumatology (Patient not taking: Reported on 07/23/2020)   No facility-administered medications prior to visit.    Review of Systems  Constitutional:  Negative for chills, fatigue and fever.  HENT:  Negative for congestion, ear pain, rhinorrhea, sneezing and sore throat.   Eyes: Negative.  Negative for pain and redness.  Respiratory:  Negative for cough, shortness of breath and wheezing.   Cardiovascular:  Negative for chest pain and  leg swelling.  Gastrointestinal:  Negative for abdominal pain, blood in stool, constipation, diarrhea and nausea.  Endocrine: Negative for polydipsia and polyphagia.  Genitourinary: Negative.  Negative for dysuria, flank pain, hematuria, pelvic pain, vaginal bleeding and vaginal discharge.  Musculoskeletal:  Negative for arthralgias, back pain, gait problem and joint swelling.  Skin:  Negative for rash.  Allergic/Immunologic: Positive for environmental allergies.  Neurological: Negative.  Negative for dizziness, tremors, seizures, weakness, light-headedness, numbness and headaches.  Hematological:  Negative for adenopathy.  Psychiatric/Behavioral: Negative.  Negative for behavioral problems, confusion and dysphoric mood. The patient is not nervous/anxious and is not hyperactive.      Objective    BP 114/62 (BP Location: Left Arm, Patient Position: Sitting, Cuff Size: Normal)    Pulse 71    Temp 98 F (36.7 C) (Oral)    Resp 16    Ht 5\' 3"  (1.6 m)    Wt 138 lb (62.6 kg)    LMP  (LMP Unknown)    SpO2 98% Comment: room air   BMI 24.45 kg/m    Physical Exam   General Appearance:    Well developed, well nourished female. Alert, cooperative, in no acute distress, appears stated age   Head:     Normocephalic, without obvious abnormality, atraumatic  Eyes:    PERRL, conjunctiva/corneas clear, EOM's intact, fundi    benign, both eyes  Ears:    Normal TM's and external ear canals, both ears  Neck:   Supple, symmetrical, trachea midline, no adenopathy;    thyroid:  no enlargement/tenderness/nodules; no carotid   bruit or JVD  Back:     Symmetric, no curvature, ROM normal, no CVA tenderness  Lungs:     Clear to auscultation bilaterally, respirations unlabored  Chest Wall:    No tenderness or deformity   Heart:    Normal heart rate. Normal rhythm. No murmurs, rubs, or gallops.   Breast Exam:    normal appearance, no masses or tenderness  Abdomen:     Soft, non-tender, bowel sounds active all four quadrants,    no masses, no organomegaly  Pelvic:    cervix normal in appearance, external genitalia normal, no adnexal masses or tenderness, no cervical motion tenderness, urethra without abnormality or discharge, and vagina normal without discharge  Extremities:   All extremities are intact. No cyanosis or edema  Pulses:   2+ and symmetric all extremities  Skin:   Skin color, texture, turgor normal, no rashes or lesions  Lymph nodes:   Cervical, supraclavicular, and axillary nodes normal  Neurologic:   CNII-XII intact, normal strength, sensation and reflexes    throughout     Last depression screening scores PHQ 2/9 Scores 07/30/2021 07/23/2020 06/14/2019  PHQ - 2 Score 0 0 1  PHQ- 9 Score - 1 5   Last fall risk screening Fall Risk  07/30/2021  Falls in the past year? 0  Number falls in past yr: 0  Injury with Fall? 0  Risk for fall due to : No Fall Risks  Follow up Falls evaluation completed   Last Audit-C alcohol use screening Alcohol Use Disorder Test (AUDIT) 07/30/2021  1. How often do you have a drink containing alcohol? 1  2. How many drinks containing alcohol do you have on a typical day when you are drinking? 0  3. How often do you have six or more drinks on one occasion?  0  AUDIT-C Score 1  Alcohol Brief Interventions/Follow-up -   A score of 3  or more in women, and 4 or more in men indicates increased risk for alcohol abuse, EXCEPT if all of the points are from question 1   No results found for any visits on 07/30/21.  Assessment & Plan    Routine Health Maintenance and Physical Exam  Exercise Activities and Dietary recommendations  Goals      Exercise 150 minutes per week (moderate activity)        Immunization History  Administered Date(s) Administered   Influenza Split 07/22/2006   Influenza,inj,Quad PF,6+ Mos 05/09/2015, 05/19/2016, 06/05/2017, 06/10/2018, 06/14/2019, 07/23/2020   Pneumococcal Conjugate-13 06/10/2018   Tdap 04/18/2014   Zoster Recombinat (Shingrix) 06/10/2018, 08/16/2018    Health Maintenance  Topic Date Due   COVID-19 Vaccine (1) Never done   HIV Screening  Never done   Pneumococcal Vaccine 38-54 Years old (2 - PPSV23 if available, else PCV20) 06/11/2019   INFLUENZA VACCINE  03/11/2021   PAP SMEAR-Modifier  05/19/2021   MAMMOGRAM  07/27/2021   COLONOSCOPY (Pts 45-11yrs Insurance coverage will need to be confirmed)  08/27/2023   DEXA SCAN  04/17/2024   TETANUS/TDAP  04/18/2024   Hepatitis C Screening  Completed   Zoster Vaccines- Shingrix  Completed   HPV VACCINES  Aged Out    Discussed health benefits of physical activity, and encouraged her to engage in regular exercise appropriate for her age and condition.  1. Annual physical exam Normal exam  - CBC - Comprehensive metabolic panel - Lipid panel  2. Immunocompromised state due to drug therapy (Cut Off) On anti RA medications followed by Dr. Dossie Der. Recommended Pneumovax-23 which she declined. Today.   3. Rheumatoid arthritis with negative rheumatoid factor, involving unspecified site [facility]) Continue follow up Dr. Dossie Der.   4. Hypercholesteremia Consuming healthy diet, balanced by high HDL - CBC - Comprehensive metabolic panel - Lipid panel  5. Cervical  cancer screening  - Cytology - HPV w/PAP any interp. Reflex 16/18 genotyping if HPV + ([facility])  6. Need for influenza vaccination  - Flu Vaccine QUAD 36+ mos IM (Fluarix/Fluzone)     The entirety of the information documented in the History of Present Illness, Review of Systems and Physical Exam were personally obtained by me. Portions of this information were initially documented by the CMA and reviewed by me for thoroughness and accuracy.     Lelon Huh, MD  [facility] [phone] (phone) [phone] (fax)  [place]

## 2021-07-30 NOTE — Patient Instructions (Addendum)
I recommend that you get the Pneumovax-23 vaccine to protect yourself from certain dangerous strains of pneumonia. You can get Pneumovax  at your pharmacy, or call our office at [phone] at your earliest convenience to schedule this vaccine.

## 2021-07-31 ENCOUNTER — Telehealth: Payer: Self-pay

## 2021-07-31 NOTE — Telephone Encounter (Signed)
I returned Cheryl Cooke's call. She states the pap order was placed incorrectly and needs to be re-entered as Lab4 with HPV selected.   Lab order has been re-entered.

## 2021-07-31 NOTE — Telephone Encounter (Signed)
Copied from [place] [phone]. Topic: General - Other >> Jul 31, 2021 11:29 AM [place], [employer] R wrote: Reason for CRM: Peter Congo from [facility] department needing correct orders for pt pap. Requesting a call back to give correct code [phone]

## 2021-07-31 NOTE — Addendum Note (Signed)
Addended by: Randal Buba on: 07/31/2021 03:05 PM   Modules accepted: Orders

## 2021-08-01 LAB — COMPREHENSIVE METABOLIC PANEL
ALT: 23 IU/L (ref 0–32)
AST: 34 IU/L (ref 0–40)
Albumin/Globulin Ratio: 1.9 (ref 1.2–2.2)
Albumin: 4.4 g/dL (ref 3.8–4.8)
Alkaline Phosphatase: 76 IU/L (ref 44–121)
BUN/Creatinine Ratio: 24 (ref 12–28)
BUN: 22 mg/dL (ref 8–27)
Bilirubin Total: 0.7 mg/dL (ref 0.0–1.2)
CO2: 23 mmol/L (ref 20–29)
Calcium: 10.3 mg/dL (ref 8.7–10.3)
Chloride: 106 mmol/L (ref 96–106)
Creatinine, Ser: 0.91 mg/dL (ref 0.57–1.00)
Globulin, Total: 2.3 g/dL (ref 1.5–4.5)
Glucose: 113 mg/dL — ABNORMAL HIGH (ref 70–99)
Potassium: 5.7 mmol/L — ABNORMAL HIGH (ref 3.5–5.2)
Sodium: 142 mmol/L (ref 134–144)
Total Protein: 6.7 g/dL (ref 6.0–8.5)
eGFR: 72 mL/min/{1.73_m2} (ref >59)

## 2021-08-01 LAB — LIPID PANEL
Chol/HDL Ratio: 2.4 ratio (ref 0.0–4.4)
Cholesterol, Total: 240 mg/dL — ABNORMAL HIGH (ref 100–199)
HDL: 98 mg/dL (ref >39)
LDL Chol Calc (NIH): 123 mg/dL — ABNORMAL HIGH (ref 0–99)
Triglycerides: 115 mg/dL (ref 0–149)
VLDL Cholesterol Cal: 19 mg/dL (ref 5–40)

## 2021-08-01 LAB — CBC
Hematocrit: 37.7 % (ref 34.0–46.6)
Hemoglobin: 12.6 g/dL (ref 11.1–15.9)
MCH: 33.6 pg — ABNORMAL HIGH (ref 26.6–33.0)
MCHC: 33.4 g/dL (ref 31.5–35.7)
MCV: 101 fL — ABNORMAL HIGH (ref 79–97)
Platelets: 273 10*3/uL (ref 150–450)
RBC: 3.75 x10E6/uL — ABNORMAL LOW (ref 3.77–5.28)
RDW: 12.9 % (ref 11.7–15.4)
WBC: 5.2 10*3/uL (ref 3.4–10.8)

## 2021-08-02 LAB — CYTOLOGY - PAP
Comment: NEGATIVE
Diagnosis: NEGATIVE
High risk HPV: NEGATIVE

## 2021-08-21 ENCOUNTER — Other Ambulatory Visit: Payer: Self-pay | Admitting: Family Medicine

## 2021-08-21 DIAGNOSIS — Z1231 Encounter for screening mammogram for malignant neoplasm of breast: Secondary | ICD-10-CM

## 2021-09-16 ENCOUNTER — Ambulatory Visit
Admission: RE | Admit: 2021-09-16 | Discharge: 2021-09-16 | Disposition: A | Discharge status: Home / Self Care | Payer: Managed Care, Other (non HMO) | Source: Ambulatory Visit | Attending: Family Medicine | Admitting: Family Medicine

## 2021-09-16 DIAGNOSIS — Z1231 Encounter for screening mammogram for malignant neoplasm of breast: Secondary | ICD-10-CM

## 2021-09-24 ENCOUNTER — Encounter: Payer: Self-pay | Admitting: Physician Assistant

## 2021-09-24 ENCOUNTER — Other Ambulatory Visit: Payer: Self-pay

## 2021-09-24 ENCOUNTER — Ambulatory Visit: Payer: Managed Care, Other (non HMO) | Admitting: Physician Assistant

## 2021-09-24 VITALS — BP 111/70 | HR 79 | Temp 98.8°F | Resp 16 | Wt 138.2 lb

## 2021-09-24 DIAGNOSIS — J029 Acute pharyngitis, unspecified: Secondary | ICD-10-CM | POA: Diagnosis not present

## 2021-09-24 DIAGNOSIS — J069 Acute upper respiratory infection, unspecified: Secondary | ICD-10-CM

## 2021-09-24 LAB — POCT RAPID STREP A (OFFICE): Rapid Strep A Screen: NEGATIVE

## 2021-09-24 NOTE — Progress Notes (Signed)
Established patient visit   [name]   DOB: 27-Mar-1961   61 y.o. Female  MRN: [id number] Visit Date: 09/24/2021  Today's healthcare provider: Dani Gobble Reyes [place], PA-C  Introduced myself to the patient as a Journalist, newspaper and provided education on APPs in clinical practice.    Chief Complaint  Patient presents with   Sore Throat   Subjective    Sore Throat  This is a new problem. The current episode started in the past 7 days. The problem has been gradually improving. There has been no fever. The patient is experiencing no pain. Associated symptoms include congestion. Pertinent negatives include no coughing, diarrhea, ear pain, headaches, shortness of breath, swollen glands, trouble swallowing or vomiting. She has had no exposure to strep. She has tried gargles for the symptoms.    States the congestion began about five days ago with sore throat States the rim of her mouth feels cracked and dry, tongue and roof of her mouth all feel irritated with burning sensation.   She has been using a Netti pot on regular basis for her sinuses.  She denies recent sick contacts  She states she felt like this previously and was diagnosed with strep pharyngitis at that time  She states she feels like her symptoms are improving since the onset.  She took a COVID test at home and this was negative - Wed or Thurs while she had symptoms   Medications: Outpatient Medications Prior to Visit  Medication Sig   calcium-vitamin D (OSCAL WITH D) 250-125 MG-UNIT per tablet Take 1 tablet by mouth daily.    EPINEPHrine 0.3 mg/0.3 mL IJ SOAJ injection U UTD   leflunomide (ARAVA) 20 MG tablet Take 20 mg by mouth daily.    pravastatin (PRAVACHOL) 40 MG tablet TAKE 1 TABLET(40 MG) BY MOUTH DAILY   Upadacitinib ER (RINVOQ) 15 MG TB24 Take 1 tablet by mouth daily.   valACYclovir (VALTREX) 1000 MG tablet Take 1 tablet (1,000 mg total) by mouth daily.   fluticasone (FLONASE) 50 MCG/ACT nasal spray     [DISCONTINUED] omeprazole (PRILOSEC) 40 MG capsule Take 1 capsule (40 mg total) by mouth daily before breakfast for 30 days.   No facility-administered medications prior to visit.    Review of Systems  Constitutional:  Negative for fatigue and fever.  HENT:  Positive for congestion and sore throat. Negative for ear pain, sinus pressure, sinus pain and trouble swallowing.   Respiratory:  Negative for cough, shortness of breath and wheezing.   Gastrointestinal:  Negative for diarrhea, nausea and vomiting.  Musculoskeletal:  Negative for arthralgias and myalgias.  Skin:  Negative for rash.  Neurological:  Negative for headaches.      Objective    BP 111/70 (BP Location: Right Arm, Patient Position: Sitting, Cuff Size: Normal)    Pulse 79    Temp 98.8 F (37.1 C) (Oral)    Resp 16    Wt 138 lb 3.2 oz (62.7 kg)    LMP  (LMP Unknown)    BMI 24.48 kg/m  {Show previous vital signs (optional):23777}  Physical Exam Vitals reviewed.  Constitutional:      Appearance: She is well-developed and normal weight.  HENT:     Head: Normocephalic and atraumatic.     Right Ear: Tympanic membrane and ear canal normal.     Left Ear: Tympanic membrane and ear canal normal.     Nose:     Right Turbinates: Not enlarged, swollen or  pale.     Left Turbinates: Not enlarged, swollen or pale.     Mouth/Throat:     Mouth: Mucous membranes are moist.     Pharynx: Uvula midline. Posterior oropharyngeal erythema present. No pharyngeal swelling, oropharyngeal exudate or uvula swelling.     Tonsils: No tonsillar exudate or tonsillar abscesses.  Eyes:     Conjunctiva/sclera: Conjunctivae normal.     Pupils: Pupils are equal, round, and reactive to light.  Cardiovascular:     Rate and Rhythm: Normal rate and regular rhythm.     Pulses: Normal pulses.     Heart sounds: Normal heart sounds.  Pulmonary:     Effort: Pulmonary effort is normal.     Breath sounds: Normal breath sounds. No wheezing, rhonchi or rales.   Musculoskeletal:     Cervical back: Normal range of motion and neck supple.     Right lower leg: No edema.     Left lower leg: No edema.  Lymphadenopathy:     Head:     Right side of head: No submental or submandibular adenopathy.     Left side of head: No submental or submandibular adenopathy.     Upper Body:     Right upper body: No supraclavicular adenopathy.     Left upper body: No supraclavicular adenopathy.  Skin:    General: Skin is warm.  Neurological:     Mental Status: She is alert.  Psychiatric:        Mood and Affect: Mood normal.        Behavior: Behavior normal.      No results found for any visits on 09/24/21.  Assessment & Plan      Problem List Items Addressed This Visit   None Visit Diagnoses     Viral upper respiratory tract infection    -  Primary Visit with patient indicates symptoms of sore throat and congestion for 5 days congruent with acute URI that is likely viral in nature  Rapid strep negative Unlikely to be flu or COVID but not tested today due to length of symptoms and being past window for antiviral therapy.  Due to nature and duration of symptoms recommended treatment regimen is symptomatic relief and follow up if needed Discussed with patient the various viral and bacterial etiologies of current illness and appropriate course of treatment Discussed OTC medication options for multisymptom relief such as Dayquil/Nyquil, Theraflu, AlkaSeltzer, etc. Discussed return precautions if symptoms are not improving or worsen over next 5-7 days.      Sore throat       Relevant Orders   POCT rapid strep A (Completed)        No follow-ups on file.   I, Peri Kreft E Savina Olshefski, PA-C, have reviewed all documentation for this visit. The documentation on 09/24/21 for the exam, diagnosis, procedures, and orders are all accurate and complete.   Patty Lopezgarcia, [place]ie [place] MPH [place], PA-C  [facility] [phone] (phone) [phone] (fax)  [place]

## 2021-09-24 NOTE — Patient Instructions (Signed)
Based on your described symptoms and the duration of symptoms it is likely that you have a viral upper respiratory infection (often called a "cold")  Symptoms can last for 3-10 days with lingering cough and intermittent symptoms lasting weeks after that.  The goal of treatment at this time is to reduce your symptoms and discomfort   You can use over the counter medications such as Dayquil/Nyquil, AlkaSeltzer formulations, etc to provide further relief of symptoms according to the manufacturer's instructions  If preferred you can use Coricidin to manage your symptoms rather than those medications mentioned above.  You can continue use of Netti Pot and saline nasal rinses to help with sinus congestion and irritation You can use Tylenol for any sore throat pain   If your symptoms do not improve or become worse in the next 5-7 days please make an apt at the office so we can see you  Go to the ER if you begin to have more serious symptoms such as shortness of breath, trouble breathing, loss of consciousness, swelling around the eyes, high fever, severe lasting headaches, vision changes or neck pain/stiffness.

## 2021-10-03 ENCOUNTER — Other Ambulatory Visit: Payer: Self-pay | Admitting: Family Medicine

## 2021-11-12 ENCOUNTER — Other Ambulatory Visit: Payer: Self-pay | Admitting: Family Medicine

## 2021-11-12 DIAGNOSIS — E7800 Pure hypercholesterolemia, unspecified: Secondary | ICD-10-CM

## 2022-08-01 ENCOUNTER — Encounter: Payer: Managed Care, Other (non HMO) | Admitting: Family Medicine

## 2022-08-29 ENCOUNTER — Other Ambulatory Visit: Payer: Self-pay

## 2022-08-29 ENCOUNTER — Telehealth: Payer: Self-pay | Admitting: Family Medicine

## 2022-08-29 DIAGNOSIS — E7800 Pure hypercholesterolemia, unspecified: Secondary | ICD-10-CM

## 2022-08-29 MED ORDER — PRAVASTATIN SODIUM 40 MG PO TABS: 40.0000 mg | ORAL_TABLET | Freq: Every day | ORAL | 0 refills | Status: AC

## 2022-08-29 NOTE — Telephone Encounter (Signed)
30 day refill sent. Pt needs appt for further refills.  KP

## 2022-08-29 NOTE — Telephone Encounter (Signed)
[facility] faxed refill request for the following medications:  pravastatin (PRAVACHOL) 40 MG tablet   Loratadine   Please advise.

## 2022-10-09 DIAGNOSIS — Z7689 Persons encountering health services in other specified circumstances: Secondary | ICD-10-CM | POA: Diagnosis not present

## 2023-01-08 DIAGNOSIS — Z79899 Other long term (current) drug therapy: Secondary | ICD-10-CM | POA: Diagnosis not present

## 2023-01-08 DIAGNOSIS — E785 Hyperlipidemia, unspecified: Secondary | ICD-10-CM | POA: Diagnosis not present

## 2023-01-08 DIAGNOSIS — M1990 Unspecified osteoarthritis, unspecified site: Secondary | ICD-10-CM | POA: Diagnosis not present

## 2023-01-08 DIAGNOSIS — M8580 Other specified disorders of bone density and structure, unspecified site: Secondary | ICD-10-CM | POA: Diagnosis not present

## 2023-01-08 DIAGNOSIS — M0569 Rheumatoid arthritis of multiple sites with involvement of other organs and systems: Secondary | ICD-10-CM | POA: Diagnosis not present

## 2023-04-07 DIAGNOSIS — Z111 Encounter for screening for respiratory tuberculosis: Secondary | ICD-10-CM | POA: Diagnosis not present

## 2023-04-14 DIAGNOSIS — M05771 Rheumatoid arthritis with rheumatoid factor of right ankle and foot without organ or systems involvement: Secondary | ICD-10-CM | POA: Diagnosis not present

## 2023-04-14 DIAGNOSIS — M79672 Pain in left foot: Secondary | ICD-10-CM | POA: Diagnosis not present

## 2023-04-14 DIAGNOSIS — M2041 Other hammer toe(s) (acquired), right foot: Secondary | ICD-10-CM | POA: Diagnosis not present

## 2023-04-14 DIAGNOSIS — M05772 Rheumatoid arthritis with rheumatoid factor of left ankle and foot without organ or systems involvement: Secondary | ICD-10-CM | POA: Diagnosis not present

## 2023-04-14 DIAGNOSIS — M2042 Other hammer toe(s) (acquired), left foot: Secondary | ICD-10-CM | POA: Diagnosis not present

## 2023-04-14 DIAGNOSIS — M79671 Pain in right foot: Secondary | ICD-10-CM | POA: Diagnosis not present

## 2023-04-23 DIAGNOSIS — M0569 Rheumatoid arthritis of multiple sites with involvement of other organs and systems: Secondary | ICD-10-CM | POA: Diagnosis not present

## 2023-04-23 DIAGNOSIS — Z79899 Other long term (current) drug therapy: Secondary | ICD-10-CM | POA: Diagnosis not present

## 2023-08-01 DIAGNOSIS — N390 Urinary tract infection, site not specified: Secondary | ICD-10-CM | POA: Diagnosis not present

## 2023-08-01 DIAGNOSIS — R350 Frequency of micturition: Secondary | ICD-10-CM | POA: Diagnosis not present

## 2023-08-07 DIAGNOSIS — R350 Frequency of micturition: Secondary | ICD-10-CM | POA: Diagnosis not present

## 2023-08-07 DIAGNOSIS — N390 Urinary tract infection, site not specified: Secondary | ICD-10-CM | POA: Diagnosis not present

## 2023-08-31 DIAGNOSIS — F4320 Adjustment disorder, unspecified: Secondary | ICD-10-CM | POA: Diagnosis not present

## 2023-09-15 DIAGNOSIS — F4320 Adjustment disorder, unspecified: Secondary | ICD-10-CM | POA: Diagnosis not present

## 2023-09-21 DIAGNOSIS — Z79899 Other long term (current) drug therapy: Secondary | ICD-10-CM | POA: Diagnosis not present

## 2023-10-06 DIAGNOSIS — Z79899 Other long term (current) drug therapy: Secondary | ICD-10-CM | POA: Diagnosis not present

## 2023-10-06 DIAGNOSIS — M0569 Rheumatoid arthritis of multiple sites with involvement of other organs and systems: Secondary | ICD-10-CM | POA: Diagnosis not present

## 2023-10-06 DIAGNOSIS — M8580 Other specified disorders of bone density and structure, unspecified site: Secondary | ICD-10-CM | POA: Diagnosis not present

## 2023-10-06 DIAGNOSIS — E785 Hyperlipidemia, unspecified: Secondary | ICD-10-CM | POA: Diagnosis not present

## 2023-10-06 DIAGNOSIS — M1990 Unspecified osteoarthritis, unspecified site: Secondary | ICD-10-CM | POA: Diagnosis not present

## 2023-10-08 DIAGNOSIS — F4320 Adjustment disorder, unspecified: Secondary | ICD-10-CM | POA: Diagnosis not present

## 2023-10-12 DIAGNOSIS — F4320 Adjustment disorder, unspecified: Secondary | ICD-10-CM | POA: Diagnosis not present

## 2023-11-09 DIAGNOSIS — F4320 Adjustment disorder, unspecified: Secondary | ICD-10-CM | POA: Diagnosis not present

## 2023-12-08 DIAGNOSIS — F4320 Adjustment disorder, unspecified: Secondary | ICD-10-CM | POA: Diagnosis not present

## 2023-12-23 DIAGNOSIS — Z79899 Other long term (current) drug therapy: Secondary | ICD-10-CM | POA: Diagnosis not present

## 2023-12-29 DIAGNOSIS — M8580 Other specified disorders of bone density and structure, unspecified site: Secondary | ICD-10-CM | POA: Diagnosis not present

## 2023-12-29 DIAGNOSIS — M1990 Unspecified osteoarthritis, unspecified site: Secondary | ICD-10-CM | POA: Diagnosis not present

## 2023-12-29 DIAGNOSIS — E785 Hyperlipidemia, unspecified: Secondary | ICD-10-CM | POA: Diagnosis not present

## 2023-12-29 DIAGNOSIS — Z79899 Other long term (current) drug therapy: Secondary | ICD-10-CM | POA: Diagnosis not present

## 2023-12-29 DIAGNOSIS — M0569 Rheumatoid arthritis of multiple sites with involvement of other organs and systems: Secondary | ICD-10-CM | POA: Diagnosis not present

## 2024-01-18 DIAGNOSIS — F4320 Adjustment disorder, unspecified: Secondary | ICD-10-CM | POA: Diagnosis not present

## 2024-01-27 DIAGNOSIS — M0569 Rheumatoid arthritis of multiple sites with involvement of other organs and systems: Secondary | ICD-10-CM | POA: Diagnosis not present

## 2024-01-27 DIAGNOSIS — Z79899 Other long term (current) drug therapy: Secondary | ICD-10-CM | POA: Diagnosis not present

## 2024-03-07 DIAGNOSIS — F4320 Adjustment disorder, unspecified: Secondary | ICD-10-CM | POA: Diagnosis not present

## 2024-03-11 DIAGNOSIS — Z79899 Other long term (current) drug therapy: Secondary | ICD-10-CM | POA: Diagnosis not present

## 2024-03-14 DIAGNOSIS — Z1389 Encounter for screening for other disorder: Secondary | ICD-10-CM | POA: Diagnosis not present

## 2024-03-14 DIAGNOSIS — Z0000 Encounter for general adult medical examination without abnormal findings: Secondary | ICD-10-CM | POA: Diagnosis not present

## 2024-03-14 DIAGNOSIS — M069 Rheumatoid arthritis, unspecified: Secondary | ICD-10-CM | POA: Diagnosis not present

## 2024-03-14 DIAGNOSIS — A6000 Herpesviral infection of urogenital system, unspecified: Secondary | ICD-10-CM | POA: Diagnosis not present

## 2024-03-16 ENCOUNTER — Other Ambulatory Visit: Payer: Self-pay | Admitting: Family Medicine

## 2024-03-16 DIAGNOSIS — Z1231 Encounter for screening mammogram for malignant neoplasm of breast: Secondary | ICD-10-CM

## 2024-03-18 DIAGNOSIS — M8580 Other specified disorders of bone density and structure, unspecified site: Secondary | ICD-10-CM | POA: Diagnosis not present

## 2024-03-18 DIAGNOSIS — M1990 Unspecified osteoarthritis, unspecified site: Secondary | ICD-10-CM | POA: Diagnosis not present

## 2024-03-18 DIAGNOSIS — Z79899 Other long term (current) drug therapy: Secondary | ICD-10-CM | POA: Diagnosis not present

## 2024-03-18 DIAGNOSIS — M0569 Rheumatoid arthritis of multiple sites with involvement of other organs and systems: Secondary | ICD-10-CM | POA: Diagnosis not present

## 2024-04-15 DIAGNOSIS — M0569 Rheumatoid arthritis of multiple sites with involvement of other organs and systems: Secondary | ICD-10-CM | POA: Diagnosis not present

## 2024-05-09 DIAGNOSIS — F4320 Adjustment disorder, unspecified: Secondary | ICD-10-CM | POA: Diagnosis not present

## 2024-05-10 DIAGNOSIS — Z1231 Encounter for screening mammogram for malignant neoplasm of breast: Secondary | ICD-10-CM | POA: Diagnosis not present

## 2024-05-13 DIAGNOSIS — Z79899 Other long term (current) drug therapy: Secondary | ICD-10-CM | POA: Diagnosis not present

## 2024-05-13 DIAGNOSIS — M1990 Unspecified osteoarthritis, unspecified site: Secondary | ICD-10-CM | POA: Diagnosis not present

## 2024-05-13 DIAGNOSIS — M8580 Other specified disorders of bone density and structure, unspecified site: Secondary | ICD-10-CM | POA: Diagnosis not present

## 2024-05-13 DIAGNOSIS — M0569 Rheumatoid arthritis of multiple sites with involvement of other organs and systems: Secondary | ICD-10-CM | POA: Diagnosis not present

## 2024-05-24 DIAGNOSIS — M2012 Hallux valgus (acquired), left foot: Secondary | ICD-10-CM | POA: Diagnosis not present

## 2024-05-24 DIAGNOSIS — M2042 Other hammer toe(s) (acquired), left foot: Secondary | ICD-10-CM | POA: Diagnosis not present

## 2024-05-24 DIAGNOSIS — M05771 Rheumatoid arthritis with rheumatoid factor of right ankle and foot without organ or systems involvement: Secondary | ICD-10-CM | POA: Diagnosis not present

## 2024-05-24 DIAGNOSIS — M05772 Rheumatoid arthritis with rheumatoid factor of left ankle and foot without organ or systems involvement: Secondary | ICD-10-CM | POA: Diagnosis not present

## 2024-05-30 DIAGNOSIS — M3501 Sicca syndrome with keratoconjunctivitis: Secondary | ICD-10-CM | POA: Diagnosis not present

## 2024-05-30 DIAGNOSIS — H35363 Drusen (degenerative) of macula, bilateral: Secondary | ICD-10-CM | POA: Diagnosis not present

## 2024-05-30 DIAGNOSIS — M05731 Rheumatoid arthritis with rheumatoid factor of right wrist without organ or systems involvement: Secondary | ICD-10-CM | POA: Diagnosis not present

## 2024-05-30 DIAGNOSIS — Z79899 Other long term (current) drug therapy: Secondary | ICD-10-CM | POA: Diagnosis not present

## 2024-05-30 DIAGNOSIS — H2513 Age-related nuclear cataract, bilateral: Secondary | ICD-10-CM | POA: Diagnosis not present
